# Patient Record
Sex: Male | Born: 1965 | Race: Black or African American | Hispanic: No | Marital: Single | Smoking: Former smoker
Health system: Southern US, Community
[De-identification: ages and names within clinical notes are randomized; demographics above are authoritative.]

## PROBLEM LIST (undated history)

## (undated) DIAGNOSIS — I10 Essential (primary) hypertension: Secondary | ICD-10-CM

## (undated) DIAGNOSIS — J45909 Unspecified asthma, uncomplicated: Secondary | ICD-10-CM

## (undated) DIAGNOSIS — J40 Bronchitis, not specified as acute or chronic: Secondary | ICD-10-CM

## (undated) DIAGNOSIS — F419 Anxiety disorder, unspecified: Secondary | ICD-10-CM

## (undated) DIAGNOSIS — F319 Bipolar disorder, unspecified: Secondary | ICD-10-CM

## (undated) DIAGNOSIS — R0602 Shortness of breath: Secondary | ICD-10-CM

## (undated) DIAGNOSIS — I219 Acute myocardial infarction, unspecified: Secondary | ICD-10-CM

## (undated) DIAGNOSIS — R51 Headache: Secondary | ICD-10-CM

## (undated) HISTORY — PX: CORONARY STENT PLACEMENT: SHX1402

---

## 2013-07-23 ENCOUNTER — Emergency Department (INDEPENDENT_AMBULATORY_CARE_PROVIDER_SITE_OTHER)
Admission: EM | Admit: 2013-07-23 | Discharge: 2013-07-23 | Disposition: A | Discharge status: Home / Self Care | Payer: Self-pay | Source: Home / Self Care | Attending: Family Medicine | Admitting: Family Medicine

## 2013-07-23 ENCOUNTER — Emergency Department (INDEPENDENT_AMBULATORY_CARE_PROVIDER_SITE_OTHER): Payer: Self-pay

## 2013-07-23 ENCOUNTER — Encounter (HOSPITAL_COMMUNITY): Payer: Self-pay | Admitting: Emergency Medicine

## 2013-07-23 DIAGNOSIS — J209 Acute bronchitis, unspecified: Secondary | ICD-10-CM

## 2013-07-23 HISTORY — DX: Acute myocardial infarction, unspecified: I21.9

## 2013-07-23 HISTORY — DX: Essential (primary) hypertension: I10

## 2013-07-23 MED ORDER — CEFDINIR 300 MG PO CAPS: 300.0000 mg | ORAL_CAPSULE | Freq: Two times a day (BID) | ORAL | Status: DC

## 2013-07-23 MED ORDER — DEXTROMETHORPHAN POLISTIREX 30 MG/5ML PO LQCR: 60.0000 mg | Freq: Two times a day (BID) | ORAL | Status: DC

## 2013-07-23 NOTE — ED Provider Notes (Signed)
CSN: [id number]     Arrival date & time 07/23/13  0865 History   First MD Initiated Contact with Patient 07/23/13 0940     Chief Complaint  Patient presents with  . Chest Pain   (Consider location/radiation/quality/duration/timing/severity/associated sxs/prior Treatment) Patient is a 47 y.o. male presenting with chest pain. The history is provided by the patient.  Chest Pain Pain location:  L lateral chest Pain quality: sharp   Pain radiates to:  Does not radiate Pain radiates to the back: no   Pain severity:  Mild Onset quality:  Sudden Duration:  12 hours Timing:  Constant Progression:  Unchanged Chronicity:  New Context comment:  Coughing last eve in spasm for uri over past 2-3 d.. Associated symptoms: cough   Associated symptoms: no abdominal pain, no fever, no palpitations and no shortness of breath   Associated symptoms comment:  Productive of yellow phlegm.   Past Medical History  Diagnosis Date  . Heart attack   . Hypertension    Past Surgical History  Procedure Laterality Date  . Coronary stent placement     History reviewed. No pertinent family history. History  Substance Use Topics  . Smoking status: Former Games developer  . Smokeless tobacco: Not on file  . Alcohol Use: No    Review of Systems  Constitutional: Negative.  Negative for fever.  HENT: Positive for congestion, postnasal drip and rhinorrhea.   Respiratory: Positive for cough. Negative for chest tightness and shortness of breath.   Cardiovascular: Positive for chest pain. Negative for palpitations and leg swelling.  Gastrointestinal: Negative.  Negative for abdominal pain.    Allergies  Review of patient's allergies indicates no known allergies.  Home Medications   Current Outpatient Rx  Name  Route  Sig  Dispense  Refill  . albuterol (PROVENTIL HFA;VENTOLIN HFA) 108 (90 BASE) MCG/ACT inhaler   Inhalation   Inhale into the lungs every 6 (six) hours as needed for wheezing or shortness of  breath.         Marland Kitchen aspirin 81 MG tablet   Oral   Take 81 mg by mouth daily.         . benazepril (LOTENSIN) 5 MG tablet   Oral   Take 5 mg by mouth daily.         . benztropine (COGENTIN) 0.5 MG tablet   Oral   Take 0.5 mg by mouth 2 (two) times daily.         . clonazePAM (KLONOPIN) 0.5 MG tablet   Oral   Take 0.5 mg by mouth 2 (two) times daily as needed for anxiety.         . divalproex (DEPAKOTE) 500 MG DR tablet   Oral   Take 500 mg by mouth 3 (three) times daily.         Marland Kitchen FLUoxetine (PROZAC) 10 MG capsule   Oral   Take 10 mg by mouth daily.         . haloperidol (HALDOL) 5 MG tablet   Oral   Take 5 mg by mouth 2 (two) times daily.         . nitroGLYCERIN (NITROSTAT) 0.4 MG SL tablet   Sublingual   Place 0.4 mg under the tongue every 5 (five) minutes as needed for chest pain.         . pravastatin (PRAVACHOL) 10 MG tablet   Oral   Take 10 mg by mouth daily.         . traZODone (  DESYREL) 100 MG tablet   Oral   Take 100 mg by mouth at bedtime.         . cefdinir (OMNICEF) 300 MG capsule   Oral   Take 1 capsule (300 mg total) by mouth 2 (two) times daily.   20 capsule   0   . dextromethorphan (DELSYM) 30 MG/5ML liquid   Oral   Take 10 mLs (60 mg total) by mouth 2 (two) times daily. For cough   89 mL   1    BP 145/87  Pulse 76  Temp(Src) 97.6 F (36.4 C) (Oral)  Resp 16  SpO2 100% Physical Exam  Nursing note and vitals reviewed. Constitutional: He is oriented to person, place, and time. He appears well-developed and well-nourished.  HENT:  Head: Normocephalic.  Right Ear: External ear normal.  Left Ear: External ear normal.  Mouth/Throat: Oropharynx is clear and moist.  Eyes: Conjunctivae are normal. Pupils are equal, round, and reactive to light.  Neck: Normal range of motion. Neck supple.  Cardiovascular: Normal rate, normal heart sounds and intact distal pulses.   Pulmonary/Chest: No respiratory distress. He has no  decreased breath sounds. He has no wheezes. He has rhonchi. He has no rales. He exhibits tenderness.  Lymphadenopathy:    He has no cervical adenopathy.  Neurological: He is alert and oriented to person, place, and time.  Skin: Skin is warm and dry.    ED Course  Procedures (including critical care time) Labs Review Labs Reviewed - No data to display Imaging Review Dg Chest 2 View  07/23/2013   CLINICAL DATA:  Chest pain for 2 days  EXAM: CHEST  2 VIEW  COMPARISON:  None.  FINDINGS: The heart size and mediastinal contours are within normal limits. Both lungs are clear. The visualized skeletal structures are unremarkable.  IMPRESSION: No active cardiopulmonary disease.   Electronically Signed   By: Elige Ko   On: 07/23/2013 10:29    EKG Interpretation     Ventricular Rate:    PR Interval:    QRS Duration:   QT Interval:    QTC Calculation:   R Axis:     Text Interpretation:              MDM  X-rays reviewed and report per radiologist.     Linna Hoff, MD 07/23/13 1104

## 2013-07-23 NOTE — ED Notes (Signed)
States he was up all night coughing and his chest is now sore

## 2013-08-29 ENCOUNTER — Other Ambulatory Visit: Payer: Self-pay

## 2013-08-29 ENCOUNTER — Emergency Department (HOSPITAL_COMMUNITY): Payer: Medicaid Other

## 2013-08-29 ENCOUNTER — Emergency Department (HOSPITAL_COMMUNITY)
Admission: EM | Admit: 2013-08-29 | Discharge: 2013-08-29 | Disposition: A | Discharge status: Home / Self Care | Payer: Medicaid Other | Attending: Emergency Medicine | Admitting: Emergency Medicine

## 2013-08-29 ENCOUNTER — Encounter (HOSPITAL_COMMUNITY): Payer: Self-pay | Admitting: Emergency Medicine

## 2013-08-29 DIAGNOSIS — Z79899 Other long term (current) drug therapy: Secondary | ICD-10-CM | POA: Insufficient documentation

## 2013-08-29 DIAGNOSIS — Z5900 Homelessness unspecified: Secondary | ICD-10-CM | POA: Insufficient documentation

## 2013-08-29 DIAGNOSIS — R05 Cough: Secondary | ICD-10-CM | POA: Insufficient documentation

## 2013-08-29 DIAGNOSIS — R071 Chest pain on breathing: Secondary | ICD-10-CM | POA: Insufficient documentation

## 2013-08-29 DIAGNOSIS — Z87891 Personal history of nicotine dependence: Secondary | ICD-10-CM | POA: Insufficient documentation

## 2013-08-29 DIAGNOSIS — I2510 Atherosclerotic heart disease of native coronary artery without angina pectoris: Secondary | ICD-10-CM | POA: Insufficient documentation

## 2013-08-29 DIAGNOSIS — F319 Bipolar disorder, unspecified: Secondary | ICD-10-CM | POA: Insufficient documentation

## 2013-08-29 DIAGNOSIS — I252 Old myocardial infarction: Secondary | ICD-10-CM | POA: Insufficient documentation

## 2013-08-29 DIAGNOSIS — R059 Cough, unspecified: Secondary | ICD-10-CM | POA: Insufficient documentation

## 2013-08-29 DIAGNOSIS — Z888 Allergy status to other drugs, medicaments and biological substances status: Secondary | ICD-10-CM | POA: Insufficient documentation

## 2013-08-29 DIAGNOSIS — R0602 Shortness of breath: Secondary | ICD-10-CM | POA: Insufficient documentation

## 2013-08-29 DIAGNOSIS — I10 Essential (primary) hypertension: Secondary | ICD-10-CM | POA: Insufficient documentation

## 2013-08-29 DIAGNOSIS — R0789 Other chest pain: Secondary | ICD-10-CM

## 2013-08-29 HISTORY — DX: Bipolar disorder, unspecified: F31.9

## 2013-08-29 LAB — CBC
HCT: 38.8 % — ABNORMAL LOW (ref 39.0–52.0)
Hemoglobin: 13.2 g/dL (ref 13.0–17.0)
MCH: 32.4 pg (ref 26.0–34.0)
MCV: 95.1 fL (ref 78.0–100.0)
RBC: 4.08 MIL/uL — ABNORMAL LOW (ref 4.22–5.81)
RDW: 12.2 % (ref 11.5–15.5)

## 2013-08-29 LAB — POCT I-STAT TROPONIN I: Troponin i, poc: 0.01 ng/mL (ref 0.00–0.08)

## 2013-08-29 LAB — BASIC METABOLIC PANEL
CO2: 24 mEq/L (ref 19–32)
Calcium: 8.8 mg/dL (ref 8.4–10.5)
GFR calc Af Amer: >90 mL/min (ref >90)
GFR calc non Af Amer: >90 mL/min (ref >90)
Glucose, Bld: 99 mg/dL (ref 70–99)
Sodium: 139 mEq/L (ref 135–145)

## 2013-08-29 MED ORDER — METHOCARBAMOL 500 MG PO TABS: 500.0000 mg | ORAL_TABLET | Freq: Two times a day (BID) | ORAL | Status: DC

## 2013-08-29 MED ORDER — ASPIRIN 81 MG PO CHEW
324.0000 mg | CHEWABLE_TABLET | Freq: Once | ORAL | Status: AC
  Administered 2013-08-29: 324 mg via ORAL
  Filled 2013-08-29: qty 4

## 2013-08-29 MED ORDER — NITROGLYCERIN 2 % TD OINT
1.0000 [in_us] | TOPICAL_OINTMENT | Freq: Once | TRANSDERMAL | Status: AC
  Administered 2013-08-29: 1 [in_us] via TOPICAL
  Filled 2013-08-29: qty 1

## 2013-08-29 NOTE — ED Notes (Signed)
Dr. Ranae Palms aware of pain.  ? Chest wall pain.  Waiting for 2nd. Troponin. Pt. Is sleeping.

## 2013-08-29 NOTE — ED Notes (Signed)
Pt. reports mid chest pain for 3 days with SOB and nausea .

## 2013-08-29 NOTE — ED Provider Notes (Signed)
CSN: [id number]     Arrival date & time 08/29/13  0306 History   First MD Initiated Contact with Patient 08/29/13 [phone]     Chief Complaint  Patient presents with  . Chest Pain   (Consider location/radiation/quality/duration/timing/severity/associated sxs/prior Treatment) HPI Patient is homeless and states he's had 2-3 days of constant central chest pain. The pain does not radiate. It is worse with deep inspiration. The pain is also worse with movement of the arms and palpation of the chest. Patient admits to a nonproductive cough. He has had no fevers or chills. Patient does have a history of coronary artery disease and states he had a stent placed back in 2009. This was done in [place]. He has not followed by cardiologist. He denies any lower extremity swelling or pain. Past Medical History  Diagnosis Date  . Heart attack   . Hypertension   . Bipolar 1 disorder    Past Surgical History  Procedure Laterality Date  . Coronary stent placement     No family history on file. History  Substance Use Topics  . Smoking status: Former Games developer  . Smokeless tobacco: Not on file  . Alcohol Use: No    Review of Systems  Constitutional: Negative for fever and chills.  Respiratory: Positive for cough and shortness of breath.   Cardiovascular: Positive for chest pain. Negative for palpitations and leg swelling.  Gastrointestinal: Negative for nausea, vomiting, abdominal pain and diarrhea.  Genitourinary: Negative for dysuria, frequency and hematuria.  Musculoskeletal: Negative for back pain, neck pain and neck stiffness.  Skin: Negative for rash and wound.  Neurological: Negative for dizziness, syncope, weakness, light-headedness, numbness and headaches.  All other systems reviewed and are negative.    Allergies  Ibuprofen and Tylenol  Home Medications   Current Outpatient Rx  Name  Route  Sig  Dispense  Refill  . albuterol (PROVENTIL HFA;VENTOLIN HFA) 108 (90 BASE) MCG/ACT  inhaler   Inhalation   Inhale into the lungs every 6 (six) hours as needed for wheezing or shortness of breath.         . divalproex (DEPAKOTE) 500 MG DR tablet   Oral   Take 500 mg by mouth 3 (three) times daily.         Marland Kitchen FLUoxetine (PROZAC) 10 MG capsule   Oral   Take 10 mg by mouth daily.         . haloperidol (HALDOL) 5 MG tablet   Oral   Take 5 mg by mouth 2 (two) times daily.         . traZODone (DESYREL) 100 MG tablet   Oral   Take 100 mg by mouth at bedtime.         . benazepril (LOTENSIN) 5 MG tablet   Oral   Take 5 mg by mouth daily.         . benztropine (COGENTIN) 0.5 MG tablet   Oral   Take 0.5 mg by mouth 2 (two) times daily.         . clonazePAM (KLONOPIN) 0.5 MG tablet   Oral   Take 0.5 mg by mouth 2 (two) times daily as needed for anxiety.         . nitroGLYCERIN (NITROSTAT) 0.4 MG SL tablet   Sublingual   Place 0.4 mg under the tongue every 5 (five) minutes as needed for chest pain.         . pravastatin (PRAVACHOL) 10 MG tablet   Oral  Take 10 mg by mouth daily.          BP 130/74  Pulse 66  Temp(Src) 98.6 F (37 C) (Oral)  Resp 14  Ht 5\' 4"  (1.626 m)  Wt 160 lb (72.576 kg)  BMI 27.45 kg/m2  SpO2 100% Physical Exam  Nursing note and vitals reviewed. Constitutional: He is oriented to person, place, and time. He appears well-developed and well-nourished. No distress.  Resting comfortably on the bed.  HENT:  Head: Normocephalic and atraumatic.  Mouth/Throat: Oropharynx is clear and moist.  Eyes: EOM are normal. Pupils are equal, round, and reactive to light.  Neck: Normal range of motion. Neck supple.  Cardiovascular: Normal rate and regular rhythm.   Pulmonary/Chest: Effort normal and breath sounds normal. No respiratory distress. He has no wheezes. He has no rales. He exhibits tenderness (tenderness to palpation over the sternum, left sternal border, right sternal border. No crepitance or deformity.).  Abdominal:  Soft. Bowel sounds are normal. He exhibits no distension. There is no tenderness. There is no rebound and no guarding.  Musculoskeletal: Normal range of motion. He exhibits no edema and no tenderness.  No calf swelling or tenderness.  Neurological: He is alert and oriented to person, place, and time.  Patient is alert and oriented x3 with clear, goal oriented speech. Patient has 5/5 motor in all extremities. Sensation is intact to light touch.    Skin: Skin is warm and dry. No rash noted. No erythema.  Psychiatric: He has a normal mood and affect. His behavior is normal.    ED Course  Procedures (including critical care time) Labs Review Labs Reviewed  CBC - Abnormal; Notable for the following:    RBC 4.08 (*)    HCT 38.8 (*)    All other components within normal limits  BASIC METABOLIC PANEL  PRO B NATRIURETIC PEPTIDE  POCT I-STAT TROPONIN I   Imaging Review Dg Chest 2 View  08/29/2013   CLINICAL DATA:  Chest pain  EXAM: CHEST  2 VIEW  COMPARISON:  07/23/2013  FINDINGS: The heart size and mediastinal contours are within normal limits. Both lungs are clear. The visualized skeletal structures are unremarkable.  IMPRESSION: No active cardiopulmonary disease.   Electronically Signed   By: Tiburcio Pea M.D.   On: 08/29/2013 04:08    EKG Interpretation   None       MDM  Likely the cause of patient's chest pain is musculoskeletal. His initial EKG and troponin are normal.  Patient is sleeping in his bed in no apparent discomfort.  Troponin x2 was negative. Patient's chest pain can be reproduced with palpation is left sternal border. He is resting comfortably. His vital signs remained stable. In fact he does have risk factors for coronary artery disease I recommend he followup with cardiology as an outpatient. Return precautions given.    Loren Racer, MD 08/31/13 [phone]

## 2013-09-23 ENCOUNTER — Emergency Department (HOSPITAL_COMMUNITY)
Admission: EM | Admit: 2013-09-23 | Discharge: 2013-09-23 | Disposition: A | Discharge status: Home / Self Care | Payer: Medicaid Other | Attending: Emergency Medicine | Admitting: Emergency Medicine

## 2013-09-23 ENCOUNTER — Emergency Department (HOSPITAL_COMMUNITY): Payer: Medicaid Other

## 2013-09-23 ENCOUNTER — Encounter (HOSPITAL_COMMUNITY): Payer: Self-pay | Admitting: Emergency Medicine

## 2013-09-23 DIAGNOSIS — R059 Cough, unspecified: Secondary | ICD-10-CM | POA: Insufficient documentation

## 2013-09-23 DIAGNOSIS — R509 Fever, unspecified: Secondary | ICD-10-CM | POA: Insufficient documentation

## 2013-09-23 DIAGNOSIS — J45909 Unspecified asthma, uncomplicated: Secondary | ICD-10-CM | POA: Insufficient documentation

## 2013-09-23 DIAGNOSIS — R079 Chest pain, unspecified: Secondary | ICD-10-CM

## 2013-09-23 DIAGNOSIS — I219 Acute myocardial infarction, unspecified: Secondary | ICD-10-CM | POA: Insufficient documentation

## 2013-09-23 DIAGNOSIS — Z79899 Other long term (current) drug therapy: Secondary | ICD-10-CM | POA: Insufficient documentation

## 2013-09-23 DIAGNOSIS — R05 Cough: Secondary | ICD-10-CM | POA: Insufficient documentation

## 2013-09-23 DIAGNOSIS — Z87891 Personal history of nicotine dependence: Secondary | ICD-10-CM | POA: Insufficient documentation

## 2013-09-23 DIAGNOSIS — I10 Essential (primary) hypertension: Secondary | ICD-10-CM | POA: Insufficient documentation

## 2013-09-23 DIAGNOSIS — J020 Streptococcal pharyngitis: Secondary | ICD-10-CM | POA: Insufficient documentation

## 2013-09-23 DIAGNOSIS — J3489 Other specified disorders of nose and nasal sinuses: Secondary | ICD-10-CM | POA: Insufficient documentation

## 2013-09-23 DIAGNOSIS — J40 Bronchitis, not specified as acute or chronic: Secondary | ICD-10-CM | POA: Insufficient documentation

## 2013-09-23 DIAGNOSIS — R197 Diarrhea, unspecified: Secondary | ICD-10-CM | POA: Insufficient documentation

## 2013-09-23 DIAGNOSIS — F319 Bipolar disorder, unspecified: Secondary | ICD-10-CM | POA: Insufficient documentation

## 2013-09-23 DIAGNOSIS — R112 Nausea with vomiting, unspecified: Secondary | ICD-10-CM | POA: Insufficient documentation

## 2013-09-23 HISTORY — DX: Bronchitis, not specified as acute or chronic: J40

## 2013-09-23 HISTORY — DX: Unspecified asthma, uncomplicated: J45.909

## 2013-09-23 LAB — BASIC METABOLIC PANEL
BUN: 19 mg/dL (ref 6–23)
CALCIUM: 9 mg/dL (ref 8.4–10.5)
CO2: 25 mEq/L (ref 19–32)
Chloride: 105 mEq/L (ref 96–112)
Creatinine, Ser: 0.81 mg/dL (ref 0.50–1.35)
GFR calc Af Amer: >90 mL/min (ref >90)
Glucose, Bld: 96 mg/dL (ref 70–99)
Potassium: 4.3 mEq/L (ref 3.7–5.3)
SODIUM: 143 meq/L (ref 137–147)

## 2013-09-23 LAB — CBC
HCT: 40.3 % (ref 39.0–52.0)
Hemoglobin: 13.6 g/dL (ref 13.0–17.0)
MCH: 32.3 pg (ref 26.0–34.0)
MCHC: 33.7 g/dL (ref 30.0–36.0)
MCV: 95.7 fL (ref 78.0–100.0)
PLATELETS: 195 10*3/uL (ref 150–400)
RBC: 4.21 MIL/uL — ABNORMAL LOW (ref 4.22–5.81)
RDW: 12.4 % (ref 11.5–15.5)
WBC: 3.7 10*3/uL — ABNORMAL LOW (ref 4.0–10.5)

## 2013-09-23 LAB — RAPID STREP SCREEN (MED CTR MEBANE ONLY): STREPTOCOCCUS, GROUP A SCREEN (DIRECT): POSITIVE — AB

## 2013-09-23 LAB — POCT I-STAT TROPONIN I: TROPONIN I, POC: 0 ng/mL (ref 0.00–0.08)

## 2013-09-23 MED ORDER — SODIUM CHLORIDE 0.9 % IV BOLUS (SEPSIS)
1000.0000 mL | Freq: Once | INTRAVENOUS | Status: AC
  Administered 2013-09-23: 1000 mL via INTRAVENOUS

## 2013-09-23 MED ORDER — PENICILLIN G BENZATHINE 1200000 UNIT/2ML IM SUSP
1.2000 10*6.[IU] | Freq: Once | INTRAMUSCULAR | Status: AC
Start: 2013-09-23 — End: 2013-09-23
  Administered 2013-09-23: 1.2 10*6.[IU] via INTRAMUSCULAR
  Filled 2013-09-23: qty 2

## 2013-09-23 MED ORDER — IBUPROFEN 400 MG PO TABS
600.0000 mg | ORAL_TABLET | Freq: Once | ORAL | Status: AC
  Administered 2013-09-23: 14:00:00 600 mg via ORAL
  Filled 2013-09-23 (×2): qty 1

## 2013-09-23 NOTE — Discharge Planning (Signed)
[facility], [employer]  Primary care being established. Patient has an appointment with the [facility] 09/28/13 at 4:00. Patient will also be obtaining the orange card on 10/12/13 at 4:30 with this practice. My contact information was given for any questions or concerns. Patient is aware of both appointments.

## 2013-09-23 NOTE — ED Notes (Signed)
PA at bedside.

## 2013-09-23 NOTE — ED Notes (Signed)
No reaction noted to antibiotic. Pt ambulates without distress. Resp easy non labored

## 2013-09-23 NOTE — ED Provider Notes (Signed)
CSN: [id number]     Arrival date & time 09/23/13  1018 History   None    Chief Complaint  Patient presents with  . URI    HPI  Jesse Watkins is a 48 y.o. male with a PMH of MI, CAD s/p stent, HTN, bipolar disorder, asthma, and bronchitis who presents to the ED for evaluation of URI.  History was provided by the patient.  Patient states that he has not been feeling well for the past 3 or 4 days. He states that he has had a productive cough with yellow sputum and intermittent blood tinged sputum. He denies gross hemoptysis. He states that he intermittently has had shortness of breath which is then relieved after using his albuterol inhaler. He denies any shortness of breath currently. He states that he has had constant chest pain with a past 3 days. His chest pain is described as a sharp sensation which is worse with coughing. His chest pain is located in the midsternal and left-sided his chest.  His chest pain today is not similar to the chest pain he had when he had an MI. Patient has also had decreased appetite, subjective fever, chills, nausea, fatigue, myalgias, intermittent lightheadedness, rhinorrhea, sore throat, nasal congestion, generalized headache, and generalized weakness. He states that last night he had 2 episodes of emesis as well as watery diarrhea. He denies any vomiting or diarrhea today. He denies any abdominal pain, hematochezia, or black or tarry stools. [facility] and [facility]". He has been trying DayQuil and NyQuil to treat his symptoms however this has not been working. Patient did not receive the flu shot this year. He did not have a cardiologist currently. Patient takes a daily aspirin. He is a previous smoker. No recent travel.   Past Medical History  Diagnosis Date  . Heart attack   . Hypertension   . Bipolar 1 disorder   . Asthma   . Bronchitis    Past Surgical History  Procedure Laterality Date  . Coronary stent placement      History reviewed. No pertinent family history. History  Substance Use Topics  . Smoking status: Former Games developer  . Smokeless tobacco: Not on file  . Alcohol Use: No    Review of Systems  Constitutional: Positive for fever (subjective), chills, activity change, appetite change and fatigue. Negative for diaphoresis.  HENT: Positive for congestion, rhinorrhea and sore throat. Negative for ear pain, sinus pressure and sneezing.   Respiratory: Positive for cough and shortness of breath (intermittent).   Cardiovascular: Positive for chest pain. Negative for leg swelling.  Gastrointestinal: Positive for nausea, vomiting and diarrhea. Negative for abdominal pain, constipation and blood in stool.  Genitourinary: Negative for dysuria, decreased urine volume and difficulty urinating.  Musculoskeletal: Positive for myalgias. Negative for arthralgias, back pain and neck pain.  Skin: Negative for color change, rash and wound.  Neurological: Positive for weakness (generalized), light-headedness (intermittent) and headaches. Negative for dizziness and syncope.  Psychiatric/Behavioral: Negative for confusion.    Allergies  Ibuprofen and Tylenol  Home Medications   Current Outpatient Rx  Name  Route  Sig  Dispense  Refill  . albuterol (PROVENTIL HFA;VENTOLIN HFA) 108 (90 BASE) MCG/ACT inhaler   Inhalation   Inhale into the lungs every 6 (six) hours as needed for wheezing or shortness of breath.         . benazepril (LOTENSIN) 5 MG tablet   Oral   Take 5 mg  by mouth daily.         . benztropine (COGENTIN) 0.5 MG tablet   Oral   Take 0.5 mg by mouth 2 (two) times daily.         . clonazePAM (KLONOPIN) 0.5 MG tablet   Oral   Take 0.5 mg by mouth 2 (two) times daily as needed for anxiety.         . divalproex (DEPAKOTE) 500 MG DR tablet   Oral   Take 500 mg by mouth 3 (three) times daily.         Marland Kitchen FLUoxetine (PROZAC) 10 MG capsule   Oral   Take 10 mg by mouth daily.          . haloperidol (HALDOL) 5 MG tablet   Oral   Take 5 mg by mouth 2 (two) times daily.         . methocarbamol (ROBAXIN) 500 MG tablet   Oral   Take 1 tablet (500 mg total) by mouth 2 (two) times daily.   20 tablet   0   . nitroGLYCERIN (NITROSTAT) 0.4 MG SL tablet   Sublingual   Place 0.4 mg under the tongue every 5 (five) minutes as needed for chest pain.         . pravastatin (PRAVACHOL) 10 MG tablet   Oral   Take 10 mg by mouth daily.         . traZODone (DESYREL) 100 MG tablet   Oral   Take 100 mg by mouth at bedtime.          BP 128/94  Pulse 74  Temp(Src) 97.9 F (36.6 C) (Oral)  Resp 15  Ht 5\' 4"  (1.626 m)  Wt 160 lb (72.576 kg)  BMI 27.45 kg/m2  SpO2 98%  Filed Vitals:   09/23/13 1300 09/23/13 1330 09/23/13 1400 09/23/13 1411  BP: 148/89 138/87 145/97 152/95  Pulse: 53 55 56 57  Temp:      TempSrc:      Resp: 15 15 19    Height:      Weight:      SpO2: 100% 98% 97% 99%    Physical Exam  Nursing note and vitals reviewed. Constitutional: He is oriented to person, place, and time. He appears well-developed and well-nourished. No distress.  HENT:  Head: Normocephalic and atraumatic.  Right Ear: External ear normal.  Left Ear: External ear normal.  Nose: Nose normal.  Mouth/Throat: Oropharynx is clear and moist. No oropharyngeal exudate.  Tympanic membranes gray and translucent bilaterally with no erythema, edema, or hemotympanum.  Mild erythema to the posterior pharynx.  Uvula midline.  No trismus.  Nasal congestion.    Eyes: Conjunctivae and EOM are normal. Pupils are equal, round, and reactive to light. Right eye exhibits no discharge. Left eye exhibits no discharge.  Neck: Normal range of motion. Neck supple.  No cervical spinal or paraspinal tenderness to palpation throughout.  No limitations with neck ROM.  No LAD  Cardiovascular: Normal rate, regular rhythm and normal heart sounds.  Exam reveals no gallop and no friction rub.   No murmur  heard. Dorsalis pedis pulses present and equal bilaterally  Pulmonary/Chest: Effort normal and breath sounds normal. No respiratory distress. He has no wheezes. He has no rales. He exhibits tenderness.  Mid-sternal tenderness to palpation  Abdominal: Soft. He exhibits no distension and no mass. There is no tenderness. There is no rebound and no guarding.  Musculoskeletal: Normal range of motion. He exhibits no  edema and no tenderness.  No pedal edema or calf tenderness bilaterally.    Neurological: He is alert and oriented to person, place, and time.  GCS 15.  No focal neurological deficits.  CN 2-12 intact.  No pronator drift.    Skin: Skin is warm and dry. He is not diaphoretic.    ED Course  Procedures (including critical care time) Labs Review Labs Reviewed  CBC  BASIC METABOLIC PANEL  PRO B NATRIURETIC PEPTIDE   Imaging Review Dg Chest 2 View  09/23/2013   CLINICAL DATA:  Upper respiratory tract infections; fever  EXAM: CHEST  2 VIEW  COMPARISON:  August 29, 2013  FINDINGS: Lungs are clear. Heart size and pulmonary vascularity are normal. No adenopathy. No bone lesions.  IMPRESSION: No abnormality noted.   Electronically Signed   By: Bretta Bang M.D.   On: 09/23/2013 11:03    EKG Interpretation   None       Date: 09/24/2013  Rate: 68  Rhythm: normal sinus rhythm  QRS Axis: normal  Intervals: normal  ST/T Wave abnormalities: normal  Conduction Disutrbances:none  Narrative Interpretation:   Old EKG Reviewed: unchanged   Results for orders placed during the hospital encounter of 09/23/13  RAPID STREP SCREEN      Result Value Range   Streptococcus, Group A Screen (Direct) POSITIVE (*) NEGATIVE  CBC      Result Value Range   WBC 3.7 (*) 4.0 - 10.5 K/uL   RBC 4.21 (*) 4.22 - 5.81 MIL/uL   Hemoglobin 13.6  13.0 - 17.0 g/dL   HCT 40.9  81.1 - 91.4 %   MCV 95.7  78.0 - 100.0 fL   MCH 32.3  26.0 - 34.0 pg   MCHC 33.7  30.0 - 36.0 g/dL   RDW 78.2  95.6 - 21.3 %    Platelets 195  150 - 400 K/uL  BASIC METABOLIC PANEL      Result Value Range   Sodium 143  137 - 147 mEq/L   Potassium 4.3  3.7 - 5.3 mEq/L   Chloride 105  96 - 112 mEq/L   CO2 25  19 - 32 mEq/L   Glucose, Bld 96  70 - 99 mg/dL   BUN 19  6 - 23 mg/dL   Creatinine, Ser 0.86  0.50 - 1.35 mg/dL   Calcium 9.0  8.4 - 57.8 mg/dL   GFR calc non Af Amer >90  >90 mL/min   GFR calc Af Amer >90  >90 mL/min  POCT I-STAT TROPONIN I      Result Value Range   Troponin i, poc 0.00  0.00 - 0.08 ng/mL   Comment 3            DG Chest 2 View (Final result)  Result time: 09/23/13 11:03:12    Final result by Rad Results In Interface (09/23/13 11:03:12)    Narrative:   CLINICAL DATA: Upper respiratory tract infections; fever  EXAM: CHEST 2 VIEW  COMPARISON: August 29, 2013  FINDINGS: Lungs are clear. Heart size and pulmonary vascularity are normal. No adenopathy. No bone lesions.  IMPRESSION: No abnormality noted.   Electronically Signed By: Bretta Bang M.D. On: 09/23/2013 11:03         MDM   Jesse Watkins is a 48 y.o. male with a PMH of MI, CAD s/p stent, HTN, bipolar disorder, asthma, and bronchitis who presents to the ED for evaluation of URI and multiple complaints.    Rechecks  2:00  PM = patient asking for ibuprofen. Patient states that he's been taking the last few days with no adverse reactions. Patient's allergies is a elevated heart rate. Denies any anaphylaxis.   Patient found to have strep pharyngitis and was treated with IM penicillin.  Patient afebrile and non-toxic in appearance.  Labs unremarkable.  Patient also complained of a cough. Chest x-ray negative for an acute cardiopulmonary process.  He also has had chest pain x 3 days which is worse with coughing.  Pain likely musculoskeletal in nature.  Pain reproducible. EKG negative for any acute ischemic changes.  Troponin negative.  Patient instructed to establish a cardiologist and was given resources.   Patient also instructed to follow-up with a PCP. Return precautions, discharge instructions, and follow-up was discussed with the patient before discharge.     Discharge Medication List as of 09/23/2013  1:58 PM     Final impressions: 1. Strep pharyngitis   2. Cough   3. Chest pain      Luiz IronJessica Katlin Brunette Lavalle PA-C   This patient was discussed with Dr. Skipper ClicheZackowski        Sair Faulcon K Gaynel Schaafsma, PA-C 09/24/13 [phone]

## 2013-09-23 NOTE — ED Notes (Signed)
Pt reports productive, n/v, chills, sweats, for past 3 days. States his chest hurts when he coughs and he has not had an appetite

## 2013-09-23 NOTE — Discharge Instructions (Signed)
Drink plenty of fluids and rest Return to the emergency department if you develop any changing/worsening condition, difficulty swallowing or breathing, stiff neck, rash, change or worsening chest pain, coughing up blood, fever, or any other concerns (please read additional information regarding your condition below)    Strep Throat Strep throat is an infection of the throat caused by a bacteria named Streptococcus pyogenes. Your caregiver may call the infection streptococcal "tonsillitis" or "pharyngitis" depending on whether there are signs of inflammation in the tonsils or back of the throat. Strep throat is most common in children aged 5 15 years during the cold months of the year, but it can occur in people of any age during any season. This infection is spread from person to person (contagious) through coughing, sneezing, or other close contact. SYMPTOMS   Fever or chills.  Painful, swollen, red tonsils or throat.  Pain or difficulty when swallowing.  White or yellow spots on the tonsils or throat.  Swollen, tender lymph nodes or "glands" of the neck or under the jaw.  Red rash all over the body (rare). DIAGNOSIS  Many different infections can cause the same symptoms. A test must be done to confirm the diagnosis so the right treatment can be given. A "rapid strep test" can help your caregiver make the diagnosis in a few minutes. If this test is not available, a light swab of the infected area can be used for a throat culture test. If a throat culture test is done, results are usually available in a day or two. TREATMENT  Strep throat is treated with antibiotic medicine. HOME CARE INSTRUCTIONS   Gargle with 1 tsp of salt in 1 cup of warm water, 3 4 times per day or as needed for comfort.  Family members who also have a sore throat or fever should be tested for strep throat and treated with antibiotics if they have the strep infection.  Make sure everyone in your household washes  their hands well.  Do not share food, drinking cups, or personal items that could cause the infection to spread to others.  You may need to eat a soft food diet until your sore throat gets better.  Drink enough water and fluids to keep your urine clear or pale yellow. This will help prevent dehydration.  Get plenty of rest.  Stay home from school, daycare, or work until you have been on antibiotics for 24 hours.  Only take over-the-counter or prescription medicines for pain, discomfort, or fever as directed by your caregiver.  If antibiotics are prescribed, take them as directed. Finish them even if you start to feel better. SEEK MEDICAL CARE IF:   The glands in your neck continue to enlarge.  You develop a rash, cough, or earache.  You cough up green, yellow-brown, or bloody sputum.  You have pain or discomfort not controlled by medicines.  Your problems seem to be getting worse rather than better. SEEK IMMEDIATE MEDICAL CARE IF:   You develop any new symptoms such as vomiting, severe headache, stiff or painful neck, chest pain, shortness of breath, or trouble swallowing.  You develop severe throat pain, drooling, or changes in your voice.  You develop swelling of the neck, or the skin on the neck becomes red and tender.  You have a fever.  You develop signs of dehydration, such as fatigue, dry mouth, and decreased urination.  You become increasingly sleepy, or you cannot wake up completely. Document Released: 08/23/2000 Document Revised: 08/12/2012 Document Reviewed:  10/25/2010 ExitCare Patient Information 2014 [place], [place].   Chest Pain (Nonspecific) It is often hard to give a specific diagnosis for the cause of chest pain. There is always a chance that your pain could be related to something serious, such as a heart attack or a blood clot in the lungs. You need to follow up with your caregiver for further evaluation. CAUSES   Heartburn.  Pneumonia or  bronchitis.  Anxiety or stress.  Inflammation around your heart (pericarditis) or lung (pleuritis or pleurisy).  A blood clot in the lung.  A collapsed lung (pneumothorax). It can develop suddenly on its own (spontaneous pneumothorax) or from injury (trauma) to the chest.  Shingles infection (herpes zoster virus). The chest wall is composed of bones, muscles, and cartilage. Any of these can be the source of the pain.  The bones can be bruised by injury.  The muscles or cartilage can be strained by coughing or overwork.  The cartilage can be affected by inflammation and become sore (costochondritis). DIAGNOSIS  Lab tests or other studies, such as X-rays, electrocardiography, stress testing, or cardiac imaging, may be needed to find the cause of your pain.  TREATMENT   Treatment depends on what may be causing your chest pain. Treatment may include:  Acid blockers for heartburn.  Anti-inflammatory medicine.  Pain medicine for inflammatory conditions.  Antibiotics if an infection is present.  You may be advised to change lifestyle habits. This includes stopping smoking and avoiding alcohol, caffeine, and chocolate.  You may be advised to keep your head raised (elevated) when sleeping. This reduces the chance of acid going backward from your stomach into your esophagus.  Most of the time, nonspecific chest pain will improve within 2 to 3 days with rest and mild pain medicine. HOME CARE INSTRUCTIONS   If antibiotics were prescribed, take your antibiotics as directed. Finish them even if you start to feel better.  For the next few days, avoid physical activities that bring on chest pain. Continue physical activities as directed.  Do not smoke.  Avoid drinking alcohol.  Only take over-the-counter or prescription medicine for pain, discomfort, or fever as directed by your caregiver.  Follow your caregiver's suggestions for further testing if your chest pain does not go  away.  Keep any follow-up appointments you made. If you do not go to an appointment, you could develop lasting (chronic) problems with pain. If there is any problem keeping an appointment, you must call to reschedule. SEEK MEDICAL CARE IF:   You think you are having problems from the medicine you are taking. Read your medicine instructions carefully.  Your chest pain does not go away, even after treatment.  You develop a rash with blisters on your chest. SEEK IMMEDIATE MEDICAL CARE IF:   You have increased chest pain or pain that spreads to your arm, neck, jaw, back, or abdomen.  You develop shortness of breath, an increasing cough, or you are coughing up blood.  You have severe back or abdominal pain, feel nauseous, or vomit.  You develop severe weakness, fainting, or chills.  You have a fever. THIS IS AN EMERGENCY. Do not wait to see if the pain will go away. Get medical help at once. Call your local emergency services (911 in U.S.). Do not drive yourself to the hospital. MAKE SURE YOU:   Understand these instructions.  Will watch your condition.  Will get help right away if you are not doing well or get worse. Document Released: 06/05/2005 Document  Revised: 11/18/2011 Document Reviewed: 03/31/2008 [facility] Patient Information 2014 [place], [place].    Cough, Adult  A cough is a reflex that helps clear your throat and airways. It can help heal the body or may be a reaction to an irritated airway. A cough may only last 2 or 3 weeks (acute) or may last more than 8 weeks (chronic).  CAUSES Acute cough:  Viral or bacterial infections. Chronic cough:  Infections.  Allergies.  Asthma.  Post-nasal drip.  Smoking.  Heartburn or acid reflux.  Some medicines.  Chronic lung problems (COPD).  Cancer. SYMPTOMS   Cough.  Fever.  Chest pain.  Increased breathing rate.  High-pitched whistling sound when breathing (wheezing).  Colored mucus that you cough up  (sputum). TREATMENT   A bacterial cough may be treated with antibiotic medicine.  A viral cough must run its course and will not respond to antibiotics.  Your caregiver may recommend other treatments if you have a chronic cough. HOME CARE INSTRUCTIONS   Only take over-the-counter or prescription medicines for pain, discomfort, or fever as directed by your caregiver. Use cough suppressants only as directed by your caregiver.  Use a cold steam vaporizer or humidifier in your bedroom or home to help loosen secretions.  Sleep in a semi-upright position if your cough is worse at night.  Rest as needed.  Stop smoking if you smoke. SEEK IMMEDIATE MEDICAL CARE IF:   You have pus in your sputum.  Your cough starts to worsen.  You cannot control your cough with suppressants and are losing sleep.  You begin coughing up blood.  You have difficulty breathing.  You develop pain which is getting worse or is uncontrolled with medicine.  You have a fever. MAKE SURE YOU:   Understand these instructions.  Will watch your condition.  Will get help right away if you are not doing well or get worse. Document Released: 02/22/2011 Document Revised: 11/18/2011 Document Reviewed: 02/22/2011 [facility] Patient Information 2014 [place], [place].   Emergency Department Resource Guide 1) Find a Doctor and Pay Out of Pocket Although you won't have to find out who is covered by your insurance plan, it is a good idea to ask around and get recommendations. You will then need to call the office and see if the doctor you have chosen will accept you as a new patient and what types of options they offer for patients who are self-pay. Some doctors offer discounts or will set up payment plans for their patients who do not have insurance, but you will need to ask so you aren't surprised when you get to your appointment.  2) Contact Your Local Health Department Not all health departments have doctors that can  see patients for sick visits, but many do, so it is worth a call to see if yours does. If you don't know where your local health department is, you can check in your phone book. The CDC also has a tool to help you locate your state's health department, and many state websites also have listings of all of their local health departments.  3) Find a Walk-in Clinic If your illness is not likely to be very severe or complicated, you may want to try a walk in clinic. These are popping up all over the country in pharmacies, drugstores, and shopping centers. They're usually staffed by nurse practitioners or physician assistants that have been trained to treat common illnesses and complaints. They're usually fairly quick and inexpensive. However, if you have serious  medical issues or chronic medical problems, these are probably not your best option.  No Primary Care Doctor: - Call Health Connect at  [phone] - they can help you locate a primary care doctor that  accepts your insurance, provides certain services, etc. - Physician Referral Service- [phone]  Chronic Pain Problems: Organization         Address  Phone   Notes  [facility]  [phone] Patients need to be referred by their primary care doctor.   Medication Assistance: Organization         Address  Phone   Notes  [facility] Medication [facility] [address] [address] [place], [place] [address] [phone] --Must be a resident of [facility] -- Must have NO insurance coverage whatsoever (no Medicaid/ Medicare, etc.) -- The pt. MUST have a primary care doctor that directs their care regularly and follows them in the community   MedAssist  [phone]   [employer]  [phone]    Agencies that provide inexpensive medical care: Organization         Address  Phone   Notes  [facility]  [phone]   [facility]    [phone]   [facility] [address] [place], [place] [address] [phone]   [facility] [address] [place] [place] [phone]   Planned Parenthood    [phone]   [facility]    [phone]   [facility]  [address], [place] Phone:  [phone], Fax:  [phone] Hours of Operation:  9 am - 6 pm, M-F.  Also accepts Medicaid/Medicare and self-pay.  [facility]  [address], [place] Phone: [phone], Fax: [phone]. Hours of Operation:  8:30 am - 5:30 pm, M-F.  Also accepts Medicaid and self-pay.  [facility] [place] [address] [place] Phone: [phone]   [facility] [address] Natasha Bence [place], [place] [phone], Ext. [phone] Mondays & Thursdays: 7-9 AM.  First 15 patients are seen on a first come, first serve basis.    Medicaid-accepting [facility] Providers:  Organization         Address  Phone   Notes  [facility] [address],  [phone] Also accepts self-pay patients.  [facility] [address] Laurell Josephs [place], [place]  [phone]   [facility] [address], [place] [phone]   [facility] [address], [place] [phone]   Renaye Rakers [address], [place]   [phone] Only accepts [place] patients after they have their name applied to their card.   Self-Pay (no insurance) in [facility]:  Organization         Address  Phone   Notes  Sickle Cell Patients, [facility] [address] [place] [address] [place], [place] [phone]   [facility] [address] [place], [place] [phone]   [facility] [place]  [address], [place] [phone]   [facility]/Dr. Osei-Bonsu   [address] [place] or [address], [place] [phone] Phone number for both [place] and [place] locations is  the same.  [facility] [address] [place] [phone]   [facility] [address] [place] or [address] [phone]   [facility] [address], [place] [phone], phone; [phone], fax Sees patients 1st and 3rd Saturday of every month.  Must not qualify for public or private insurance (i.e. Medicaid, Medicare, [place] Health Choice, Veterans' Benefits)  Household income should be no more than 200% of the poverty level The clinic cannot treat you if you are pregnant or think you are pregnant  Sexually transmitted diseases are not treated at the clinic.    Dental Care: Organization         Address  Phone  Notes  [facility] Department of [facility] [address] [place], [place] [phone] Accepts children up to age 46 who are enrolled in [place] or [place] Health Choice; pregnant women with a Medicaid card; and children who have applied for Medicaid or [place] Health Choice, but were declined, whose parents can pay a reduced fee at time of service.  [facility] Department of [facility]  [address], [place] [phone] Accepts children up to age 44 who are enrolled in [place] or [place] Health Choice; pregnant women with a Medicaid card; and children who have applied for Medicaid or [place] Health Choice, but were declined, whose parents can pay a reduced fee at time of service.  [facility]  [address] [place], [place] [phone] Patients are seen by appointment only. Walk-ins are not accepted. [facility] will see patients 52 years of age and older. Monday - Tuesday (8am-5pm) Most Wednesdays (8:30-5pm) $30 per visit, cash only  [facility]  PROGRAM  [address], [facility] [phone] Patients are seen by appointment only. Walk-ins are not accepted. [facility] will see patients 49 years of age and older. One Wednesday Evening (Monthly: Volunteer Based).  $30 per visit, cash only  [employer]  [phone] for adults; Children under age 53, call Graduate Pediatric Dentistry at [phone]. Children aged 36-14, please call [phone] to request a pediatric application.  Dental services are provided in all areas of dental care including fillings, crowns and bridges, complete and partial dentures, implants, gum treatment, root canals, and extractions. Preventive care is also provided. Treatment is provided to both adults and children. Patients are selected via a lottery and there is often a waiting list.   [facility] [address], [place]  [phone] [email or web address]   [facility] [address] [place], [place] [phone], Ext. [phone] Second and Fourth Thursday of each month, opens at 6:30 AM; Clinic ends at 9 AM.  Patients are seen on a first-come first-served basis, and a limited number are seen during each clinic.   [facility]  [address] Ether Griffins [place], [place] [phone]   Eligibility Requirements You must have lived in [place], [place], or [place] counties for at least the last three months.   You cannot be eligible for state or federal sponsored [place], including [employer], [place], or [employer].   You generally cannot be eligible for healthcare insurance through your employer.    How to apply: Eligibility screenings are held every Tuesday and Wednesday afternoon from 1:00 pm until 4:00 pm. You do not need an appointment for  the interview!  [facility] [address], [place], [place] [phone]   [facility]  [phone]   [facility]   [phone]   [facility]  [phone]    Behavioral Health Resources in the Community: Intensive Outpatient Programs Organization         Address  Phone  Notes  [facility] [address], [place], [place] [phone]   [facility] [address], [place], [place] [phone]   [facility] [address], [place], [place]  [phone]   [facility] [address],  [place], [place] [phone] or [phone]   Substance Abuse Resources Organization         Address  Phone  Notes  [facility]  [phone]   [facility]  [phone]   [facility]  [phone]   [facility]  [phone]   Residential & Outpatient Substance Abuse Program  [phone]   Psychological Services Organization         Address  Phone  Notes  [facility]  [phone]   [facility]  [phone]   [facility] [address], [place] [phone] or [phone]    Mobile Crisis Teams Organization         Address  Phone  Notes  [facility], Mobile Crisis Care Unit  [phone]   [facility]  [address] [place], [place] [phone]   Doristine LocksSharon DeEsch [address] [place] [phone]    Self-Help/Support Groups Organization         Address  Phone             Notes  [facility] [place] - variety of support groups  336- [phone] Call for more information  Narcotics Anonymous (NA), [facility] [address], [place]  2 meetings at this location   Statisticianesidential Treatment Programs Organization         Address  Phone  Notes  [facility] [address],    [place]  [phone]   [facility]  [address], [place] [phone], [place], [place] [phone]   [facility] [address], [place] [phone] Admissions: 8am-3pm M-F  [facility] [address]    [place], [place] [phone]   [facility] [address], [place], [place] [phone]   [facility] [address]  [place], [place] [phone]   [facility] - Intensive Outpatient [address], [place], [place] [phone]   [facility] ([facility]iction [facility] [address]  [place], [place] [phone] or [phone]   [facility] ([facility]) [address] [place], [place] [phone] Accepts Medicaid  [facility] [address]  [place] [phone] Substance Abuse/Addiction Treatment   [facility] Resources Organization         Address  Phone  Notes  [facility]  [phone]   Angie FavaJulie Brannon, PhD [address] [place], [place]   [phone] or [phone]   [facility]   [address] [place], [place] [phone]   [facility] [address], [place], [place] [phone] Insurance/Medicaid/sponsorship through [facility]  [facility]ith and Families [address]  [place], [place] [phone] [place], [place] [phone]    Dr. Adele Schilder  [phone]   [facility] Dept. 1) [address] [place] 2) [place] 3)  [address], [place] [phone]  [phone]   [place] [phone] or [phone] (After Hours)

## 2013-09-26 NOTE — ED Provider Notes (Signed)
Medical screening examination/treatment/procedure(s) were performed by non-physician practitioner and as supervising physician I was immediately available for consultation/collaboration.  EKG Interpretation    Date/Time:  Thursday September 23 2013 10:22:30 EST Ventricular Rate:  68 PR Interval:  152 QRS Duration: 80 QT Interval:  394 QTC Calculation: 418 R Axis:   48 Text Interpretation:  Normal sinus rhythm Possible Left atrial enlargement Borderline ECG ED PHYSICIAN INTERPRETATION AVAILABLE IN [facility] Confirmed by TEST, RECORD ([id number]) on 09/25/2013 1:18:33 PM              Shelda Jakes, MD 09/26/13 1028

## 2013-09-28 ENCOUNTER — Ambulatory Visit: Payer: Medicaid Other | Attending: Internal Medicine

## 2013-10-12 ENCOUNTER — Ambulatory Visit: Payer: Medicaid Other | Attending: Internal Medicine | Admitting: Internal Medicine

## 2013-10-12 ENCOUNTER — Encounter: Payer: Self-pay | Admitting: Internal Medicine

## 2013-10-12 VITALS — BP 145/90 | HR 95 | Temp 98.9°F | Resp 18 | Ht 65.0 in | Wt 166.0 lb

## 2013-10-12 DIAGNOSIS — F3289 Other specified depressive episodes: Secondary | ICD-10-CM

## 2013-10-12 DIAGNOSIS — I2510 Atherosclerotic heart disease of native coronary artery without angina pectoris: Secondary | ICD-10-CM

## 2013-10-12 DIAGNOSIS — I10 Essential (primary) hypertension: Secondary | ICD-10-CM

## 2013-10-12 DIAGNOSIS — F319 Bipolar disorder, unspecified: Secondary | ICD-10-CM

## 2013-10-12 DIAGNOSIS — I252 Old myocardial infarction: Secondary | ICD-10-CM

## 2013-10-12 DIAGNOSIS — E785 Hyperlipidemia, unspecified: Secondary | ICD-10-CM

## 2013-10-12 DIAGNOSIS — F329 Major depressive disorder, single episode, unspecified: Secondary | ICD-10-CM | POA: Insufficient documentation

## 2013-10-12 DIAGNOSIS — J45909 Unspecified asthma, uncomplicated: Secondary | ICD-10-CM

## 2013-10-12 DIAGNOSIS — F32A Depression, unspecified: Secondary | ICD-10-CM

## 2013-10-12 MED ORDER — NITROGLYCERIN 0.4 MG SL SUBL: 0.4000 mg | SUBLINGUAL_TABLET | SUBLINGUAL | Status: DC | PRN

## 2013-10-12 MED ORDER — PRAVASTATIN SODIUM 10 MG PO TABS: 10.0000 mg | ORAL_TABLET | Freq: Every day | ORAL | Status: DC

## 2013-10-12 MED ORDER — BENAZEPRIL HCL 5 MG PO TABS: 5.0000 mg | ORAL_TABLET | Freq: Every day | ORAL | Status: DC

## 2013-10-12 NOTE — Progress Notes (Signed)
Pt here to establish HTN  Noncompliant with medications Ran out of meds x 3 weeks ago C/o headache 7/10 Pt has scripts from [place] for psych meds

## 2013-10-12 NOTE — Progress Notes (Signed)
Patient Demographics  Jesse Watkins, is a 48 y.o. male  XKG:[id number]  FWY:[id number]  DOB - 06-25-1966  CC:  Chief Complaint  Patient presents with  . Establish Care  . Hypertension  . Medication Refill       HPI: Jesse Watkins is a 48 y.o. male here today to establish medical care. He has history of hypertension, history of MI CAD status post PCI with stent as per patient he has been taking aspirin could not afford the Plavix in the past, has lost follow up with cardiology occasionally has chest pain and is requesting refill on nitroglycerin, also history of anxiety depression bipolar disorder following up with psychiatrist. Patient currently has No headache, No chest pain, No abdominal pain - No Nausea, No new weakness tingling or numbness, No Cough - SOB. Patient is requesting refill on his medication, has not taken his blood pressure medication for the last one month today's repeat blood pressure is 145/90. Allergies  Allergen Reactions  . Tylenol [Acetaminophen] Other (See Comments)    Increases heart rate    Past Medical History  Diagnosis Date  . Heart attack   . Hypertension   . Bipolar 1 disorder   . Asthma   . Bronchitis    Current Outpatient Prescriptions on File Prior to Visit  Medication Sig Dispense Refill  . albuterol (PROVENTIL HFA;VENTOLIN HFA) 108 (90 BASE) MCG/ACT inhaler Inhale into the lungs every 6 (six) hours as needed for wheezing or shortness of breath.      . benztropine (COGENTIN) 0.5 MG tablet Take 0.5 mg by mouth 2 (two) times daily.      . clonazePAM (KLONOPIN) 0.5 MG tablet Take 0.5 mg by mouth 2 (two) times daily as needed for anxiety.      . divalproex (DEPAKOTE) 500 MG DR tablet Take 500 mg by mouth 3 (three) times daily.      Marland Kitchen FLUoxetine (PROZAC) 10 MG capsule Take 10 mg by mouth daily.      . haloperidol (HALDOL) 5 MG tablet Take 5 mg by mouth 2 (two) times daily.      . methocarbamol (ROBAXIN) 500 MG tablet Take 1 tablet (500 mg total)  by mouth 2 (two) times daily.  20 tablet  0  . traZODone (DESYREL) 100 MG tablet Take 100 mg by mouth at bedtime.       No current facility-administered medications on file prior to visit.   Family History  Problem Relation Age of Onset  . Heart disease Mother   . Heart disease Father   . Stroke Brother   . Cancer Maternal Grandmother     bone cancer    History   Social History  . Marital Status: Single    Spouse Name: N/A    Number of Children: N/A  . Years of Education: N/A   Occupational History  . Not on file.   Social History Main Topics  . Smoking status: Former Games developer  . Smokeless tobacco: Not on file  . Alcohol Use: No  . Drug Use: No  . Sexual Activity: Not on file   Other Topics Concern  . Not on file   Social History Narrative  . No narrative on file    Review of Systems: Constitutional: Negative for fever, chills, diaphoresis, activity change, appetite change and fatigue. HENT: Negative for ear pain, nosebleeds, congestion, facial swelling, rhinorrhea, neck pain, neck stiffness and ear discharge.  Eyes: Negative for pain, discharge, redness, itching and visual disturbance. Respiratory: Negative for  cough, choking, chest tightness, shortness of breath, wheezing and stridor.  Cardiovascular: Negative for chest pain, palpitations and leg swelling. Gastrointestinal: Negative for abdominal distention. Genitourinary: Negative for dysuria, urgency, frequency, hematuria, flank pain, decreased urine volume, difficulty urinating and dyspareunia.  Musculoskeletal: Negative for back pain, joint swelling, arthralgia and gait problem. Neurological: Negative for dizziness, tremors, seizures, syncope, facial asymmetry, speech difficulty, weakness, light-headedness, numbness and headaches.  Hematological: Negative for adenopathy. Does not bruise/bleed easily. Psychiatric/Behavioral: Negative for hallucinations, behavioral problems, confusion, dysphoric mood, decreased  concentration and agitation.    Objective:   Filed Vitals:   10/12/13 1717  BP: 145/90  Pulse:   Temp:   Resp:     Physical Exam: Constitutional: Patient appears well-developed and well-nourished. No distress. HENT: Normocephalic, atraumatic, External right and left ear normal. Oropharynx is clear and moist.  Eyes: Conjunctivae and EOM are normal. PERRLA, no scleral icterus. Neck: Normal ROM. Neck supple. No JVD. No tracheal deviation. No thyromegaly. CVS: RRR, S1/S2 +, no murmurs, no gallops, no carotid bruit.  Pulmonary: Effort and breath sounds normal, no stridor, rhonchi, wheezes, rales.  Abdominal: Soft. BS +, no distension, tenderness, rebound or guarding.  Musculoskeletal: Normal range of motion. No edema and no tenderness.  Neuro: Alert. Normal reflexes, muscle tone coordination. No cranial nerve deficit. Skin: Skin is warm and dry. No rash noted. Not diaphoretic. No erythema. No pallor.  Lab Results  Component Value Date   WBC 3.7* 09/23/2013   HGB 13.6 09/23/2013   HCT 40.3 09/23/2013   MCV 95.7 09/23/2013   PLT 195 09/23/2013   Lab Results  Component Value Date   CREATININE 0.81 09/23/2013   BUN 19 09/23/2013   NA 143 09/23/2013   K 4.3 09/23/2013   CL 105 09/23/2013   CO2 25 09/23/2013    No results found for this basename: HGBA1C   Lipid Panel  No results found for this basename: chol, trig, hdl, cholhdl, vldl, ldlcalc       Assessment and plan:   1. Essential hypertension, benign Medication refill done, advise for low salt diet.  - benazepril (LOTENSIN) 5 MG tablet; Take 1 tablet (5 mg total) by mouth daily.  Dispense: 30 tablet; Refill: 3  2. Unspecified asthma(493.90) Uses albuterol when necessary.  3. Depression/Bipolar disorder, unspecified  Continue to follow with psychiatrist   5. History of MI (myocardial infarction)  - Ambulatory referral to Cardiology  6. Other and unspecified hyperlipidemia  - pravastatin (PRAVACHOL) 10 MG tablet;  Take 1 tablet (10 mg total) by mouth daily.  Dispense: 30 tablet; Refill: 3  7. CAD (coronary artery disease)  - Ambulatory referral to Cardiology - nitroGLYCERIN (NITROSTAT) 0.4 MG SL tablet; Place 1 tablet (0.4 mg total) under the tongue every 5 (five) minutes as needed for chest pain.  Dispense: 30 tablet; Refill: 0 - pravastatin (PRAVACHOL) 10 MG tablet; Take 1 tablet (10 mg total) by mouth daily.  Dispense: 30 tablet; Refill: 3   Return in about 6 weeks (around 11/23/2013).   Doris CheadleADVANI, Shallon Yaklin, MD

## 2013-10-12 NOTE — Patient Instructions (Signed)
2 Gram Low Sodium Diet A 2 gram sodium diet restricts the amount of sodium in the diet to no more than 2 g or 2000 mg daily. Limiting the amount of sodium is often used to help lower blood pressure. It is important if you have heart, liver, or kidney problems. Many foods contain sodium for flavor and sometimes as a preservative. When the amount of sodium in a diet needs to be low, it is important to know what to look for when choosing foods and drinks. The following includes some information and guidelines to help make it easier for you to adapt to a low sodium diet. QUICK TIPS  Do not add salt to food.  Avoid convenience items and fast food.  Choose unsalted snack foods.  Buy lower sodium products, often labeled as "lower sodium" or "no salt added."  Check food labels to learn how much sodium is in 1 serving.  When eating at a restaurant, ask that your food be prepared with less salt or none, if possible. READING FOOD LABELS FOR SODIUM INFORMATION The nutrition facts label is a good place to find how much sodium is in foods. Look for products with no more than 500 to 600 mg of sodium per meal and no more than 150 mg per serving. Remember that 2 g = 2000 mg. The food label may also list foods as:  Sodium-free: Less than 5 mg in a serving.  Very low sodium: 35 mg or less in a serving.  Low-sodium: 140 mg or less in a serving.  Light in sodium: 50% less sodium in a serving. For example, if a food that usually has 300 mg of sodium is changed to become light in sodium, it will have 150 mg of sodium.  Reduced sodium: 25% less sodium in a serving. For example, if a food that usually has 400 mg of sodium is changed to reduced sodium, it will have 300 mg of sodium. CHOOSING FOODS Grains  Avoid: Salted crackers and snack items. Some cereals, including instant hot cereals. Bread stuffing and biscuit mixes. Seasoned rice or pasta mixes.  Choose: Unsalted snack items. Low-sodium cereals, oats,  puffed wheat and rice, shredded wheat. English muffins and bread. Pasta. Meats  Avoid: Salted, canned, smoked, spiced, pickled meats, including fish and poultry. Bacon, ham, sausage, cold cuts, hot dogs, anchovies.  Choose: Low-sodium canned tuna and salmon. Fresh or frozen meat, poultry, and fish. Dairy  Avoid: Processed cheese and spreads. Cottage cheese. Buttermilk and condensed milk. Regular cheese.  Choose: Milk. Low-sodium cottage cheese. Yogurt. Sour cream. Low-sodium cheese. Fruits and Vegetables  Avoid: Regular canned vegetables. Regular canned tomato sauce and paste. Frozen vegetables in sauces. Olives. Pickles. Relishes. Sauerkraut.  Choose: Low-sodium canned vegetables. Low-sodium tomato sauce and paste. Frozen or fresh vegetables. Fresh and frozen fruit. Condiments  Avoid: Canned and packaged gravies. Worcestershire sauce. Tartar sauce. Barbecue sauce. Soy sauce. Steak sauce. Ketchup. Onion, garlic, and table salt. Meat flavorings and tenderizers.  Choose: Fresh and dried herbs and spices. Low-sodium varieties of mustard and ketchup. Lemon juice. Tabasco sauce. Horseradish. SAMPLE 2 GRAM SODIUM MEAL PLAN Breakfast / Sodium (mg)  1 cup low-fat milk / 143 mg  2 slices whole-wheat toast / 270 mg  1 tbs heart-healthy margarine / 153 mg  1 hard-boiled egg / 139 mg  1 small orange / 0 mg Lunch / Sodium (mg)  1 cup raw carrots / 76 mg   cup hummus / 298 mg  1 cup low-fat milk /   143 mg   cup red grapes / 2 mg  1 whole-wheat pita bread / 356 mg Dinner / Sodium (mg)  1 cup whole-wheat pasta / 2 mg  1 cup low-sodium tomato sauce / 73 mg  3 oz lean ground beef / 57 mg  1 small side salad (1 cup raw spinach leaves,  cup cucumber,  cup yellow bell pepper) with 1 tsp olive oil and 1 tsp red wine vinegar / 25 mg Snack / Sodium (mg)  1 container low-fat vanilla yogurt / 107 mg  3 graham cracker squares / 127 mg Nutrient Analysis  Calories: 2033  Protein:  77 g  Carbohydrate: 282 g  Fat: 72 g  Sodium: 1971 mg Document Released: 08/26/2005 Document Revised: 11/18/2011 Document Reviewed: 11/27/2009 [employer] Patient Information 2014 [employer].  

## 2013-10-15 ENCOUNTER — Emergency Department (HOSPITAL_COMMUNITY): Payer: Medicaid Other

## 2013-10-15 ENCOUNTER — Encounter (HOSPITAL_COMMUNITY): Payer: Self-pay | Admitting: Emergency Medicine

## 2013-10-15 ENCOUNTER — Emergency Department (HOSPITAL_COMMUNITY)
Admission: EM | Admit: 2013-10-15 | Discharge: 2013-10-15 | Disposition: A | Discharge status: Home / Self Care | Payer: Medicaid Other | Attending: Emergency Medicine | Admitting: Emergency Medicine

## 2013-10-15 DIAGNOSIS — Z79899 Other long term (current) drug therapy: Secondary | ICD-10-CM | POA: Insufficient documentation

## 2013-10-15 DIAGNOSIS — R5383 Other fatigue: Secondary | ICD-10-CM

## 2013-10-15 DIAGNOSIS — I252 Old myocardial infarction: Secondary | ICD-10-CM | POA: Insufficient documentation

## 2013-10-15 DIAGNOSIS — Z9861 Coronary angioplasty status: Secondary | ICD-10-CM | POA: Insufficient documentation

## 2013-10-15 DIAGNOSIS — I10 Essential (primary) hypertension: Secondary | ICD-10-CM | POA: Insufficient documentation

## 2013-10-15 DIAGNOSIS — R079 Chest pain, unspecified: Secondary | ICD-10-CM

## 2013-10-15 DIAGNOSIS — Z87891 Personal history of nicotine dependence: Secondary | ICD-10-CM | POA: Insufficient documentation

## 2013-10-15 DIAGNOSIS — R5381 Other malaise: Secondary | ICD-10-CM | POA: Insufficient documentation

## 2013-10-15 DIAGNOSIS — R45851 Suicidal ideations: Secondary | ICD-10-CM | POA: Insufficient documentation

## 2013-10-15 DIAGNOSIS — J45901 Unspecified asthma with (acute) exacerbation: Secondary | ICD-10-CM | POA: Insufficient documentation

## 2013-10-15 DIAGNOSIS — F319 Bipolar disorder, unspecified: Secondary | ICD-10-CM | POA: Insufficient documentation

## 2013-10-15 LAB — RAPID URINE DRUG SCREEN, HOSP PERFORMED
AMPHETAMINES: NOT DETECTED
BENZODIAZEPINES: NOT DETECTED
Barbiturates: NOT DETECTED
Cocaine: NOT DETECTED
OPIATES: NOT DETECTED
TETRAHYDROCANNABINOL: NOT DETECTED

## 2013-10-15 LAB — COMPREHENSIVE METABOLIC PANEL
ALBUMIN: 3.7 g/dL (ref 3.5–5.2)
ALT: 27 U/L (ref 0–53)
AST: 28 U/L (ref 0–37)
Alkaline Phosphatase: 68 U/L (ref 39–117)
BUN: 21 mg/dL (ref 6–23)
CO2: 26 mEq/L (ref 19–32)
CREATININE: 0.86 mg/dL (ref 0.50–1.35)
Calcium: 8.8 mg/dL (ref 8.4–10.5)
Chloride: 103 mEq/L (ref 96–112)
GFR calc Af Amer: >90 mL/min (ref >90)
GFR calc non Af Amer: >90 mL/min (ref >90)
Glucose, Bld: 100 mg/dL — ABNORMAL HIGH (ref 70–99)
Potassium: 4.1 mEq/L (ref 3.7–5.3)
Sodium: 141 mEq/L (ref 137–147)
Total Bilirubin: 0.4 mg/dL (ref 0.3–1.2)
Total Protein: 7 g/dL (ref 6.0–8.3)

## 2013-10-15 LAB — CBC WITH DIFFERENTIAL/PLATELET
Basophils Absolute: 0 10*3/uL (ref 0.0–0.1)
Basophils Relative: 1 % (ref 0–1)
EOS PCT: 1 % (ref 0–5)
Eosinophils Absolute: 0.1 10*3/uL (ref 0.0–0.7)
HCT: 39.3 % (ref 39.0–52.0)
Hemoglobin: 13.2 g/dL (ref 13.0–17.0)
LYMPHS ABS: 2.1 10*3/uL (ref 0.7–4.0)
LYMPHS PCT: 53 % — AB (ref 12–46)
MCH: 31.9 pg (ref 26.0–34.0)
MCHC: 33.6 g/dL (ref 30.0–36.0)
MCV: 94.9 fL (ref 78.0–100.0)
Monocytes Absolute: 0.3 10*3/uL (ref 0.1–1.0)
Monocytes Relative: 7 % (ref 3–12)
NEUTROS ABS: 1.5 10*3/uL — AB (ref 1.7–7.7)
Neutrophils Relative %: 39 % — ABNORMAL LOW (ref 43–77)
PLATELETS: 198 10*3/uL (ref 150–400)
RBC: 4.14 MIL/uL — AB (ref 4.22–5.81)
RDW: 12.5 % (ref 11.5–15.5)
WBC: 4 10*3/uL (ref 4.0–10.5)

## 2013-10-15 LAB — POCT I-STAT TROPONIN I: Troponin i, poc: 0.01 ng/mL (ref 0.00–0.08)

## 2013-10-15 NOTE — Discharge Instructions (Signed)
Chest Pain (Nonspecific) °It is often hard to give a specific diagnosis for the cause of chest pain. There is always a chance that your pain could be related to something serious, such as a heart attack or a blood clot in the lungs. You need to follow up with your caregiver for further evaluation. °CAUSES  °· Heartburn. °· Pneumonia or bronchitis. °· Anxiety or stress. °· Inflammation around your heart (pericarditis) or lung (pleuritis or pleurisy). °· A blood clot in the lung. °· A collapsed lung (pneumothorax). It can develop suddenly on its own (spontaneous pneumothorax) or from injury (trauma) to the chest. °· Shingles infection (herpes zoster virus). °The chest wall is composed of bones, muscles, and cartilage. Any of these can be the source of the pain. °· The bones can be bruised by injury. °· The muscles or cartilage can be strained by coughing or overwork. °· The cartilage can be affected by inflammation and become sore (costochondritis). °DIAGNOSIS  °Lab tests or other studies, such as X-rays, electrocardiography, stress testing, or cardiac imaging, may be needed to find the cause of your pain.  °TREATMENT  °· Treatment depends on what may be causing your chest pain. Treatment may include: °· Acid blockers for heartburn. °· Anti-inflammatory medicine. °· Pain medicine for inflammatory conditions. °· Antibiotics if an infection is present. °· You may be advised to change lifestyle habits. This includes stopping smoking and avoiding alcohol, caffeine, and chocolate. °· You may be advised to keep your head raised (elevated) when sleeping. This reduces the chance of acid going backward from your stomach into your esophagus. °· Most of the time, nonspecific chest pain will improve within 2 to 3 days with rest and mild pain medicine. °HOME CARE INSTRUCTIONS  °· If antibiotics were prescribed, take your antibiotics as directed. Finish them even if you start to feel better. °· For the next few days, avoid physical  activities that bring on chest pain. Continue physical activities as directed. °· Do not smoke. °· Avoid drinking alcohol. °· Only take over-the-counter or prescription medicine for pain, discomfort, or fever as directed by your caregiver. °· Follow your caregiver's suggestions for further testing if your chest pain does not go away. °· Keep any follow-up appointments you made. If you do not go to an appointment, you could develop lasting (chronic) problems with pain. If there is any problem keeping an appointment, you must call to reschedule. °SEEK MEDICAL CARE IF:  °· You think you are having problems from the medicine you are taking. Read your medicine instructions carefully. °· Your chest pain does not go away, even after treatment. °· You develop a rash with blisters on your chest. °SEEK IMMEDIATE MEDICAL CARE IF:  °· You have increased chest pain or pain that spreads to your arm, neck, jaw, back, or abdomen. °· You develop shortness of breath, an increasing cough, or you are coughing up blood. °· You have severe back or abdominal pain, feel nauseous, or vomit. °· You develop severe weakness, fainting, or chills. °· You have a fever. °THIS IS AN EMERGENCY. Do not wait to see if the pain will go away. Get medical help at once. Call your local emergency services (911 in [place] Do not drive yourself to the hospital. °MAKE SURE YOU:  °· Understand these instructions. °· Will watch your condition. °· Will get help right away if you are not doing well or get worse. °Document Released: 06/05/2005 Document Revised: 11/18/2011 Document Reviewed: 03/31/2008 °ExitCare® Patient Information ©2014 [employer]   [employer]. ° °

## 2013-10-15 NOTE — ED Provider Notes (Signed)
CSN: [id number]     Arrival date & time 10/15/13  0515 History   First MD Initiated Contact with Patient 10/15/13 [phone]     Chief Complaint  Patient presents with  . Chest Pain  . Shortness of Breath  . Suicidal   (Consider location/radiation/quality/duration/timing/severity/associated sxs/prior Treatment) HPI This patient is a 48 yo man with a self reported history of HTN and history MI who is BIB EMS from the shelter where he resides. He says he has had two days of intermittent right sided chest pain. He says he has had this pain frequently since he had an MI in 2007.  He denies SOB. No cough. No fever. No abd pain. No pain at this time. Pain is mild, burning and nonradiating. Nothing makes it worse or better.   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 He says he feels like everything will be OK if he can just get a note excusing him from work today.   The patient also states that he has been evaluated at this hospital on multiple previous occasions for chest pain. However, I see evidence of only two previous ED visits. Patient denies any recent cocaine use.   Past Medical History  Diagnosis Date  . Heart attack   . Hypertension   . Bipolar 1 disorder   . Asthma   . Bronchitis    Past Surgical History  Procedure Laterality Date  . Coronary stent placement     Family History  Problem Relation Age of Onset  . Heart disease Mother   . Heart disease Father   . Stroke Brother   . Cancer Maternal Grandmother     bone cancer    History  Substance Use Topics  . Smoking status: Former Games developer  . Smokeless tobacco: Not on file  . Alcohol Use: No    Review of Systems Ten point review of symptoms performed and is negative with the exception of symptoms noted above.   Allergies  Tylenol  Home Medications   Current Outpatient Rx  Name  Route  Sig  Dispense  Refill  . albuterol (PROVENTIL HFA;VENTOLIN HFA) 108  (90 BASE) MCG/ACT inhaler   Inhalation   Inhale into the lungs every 6 (six) hours as needed for wheezing or shortness of breath.         . benazepril (LOTENSIN) 5 MG tablet   Oral   Take 1 tablet (5 mg total) by mouth daily.   30 tablet   3   . benztropine (COGENTIN) 0.5 MG tablet   Oral   Take 0.5 mg by mouth 2 (two) times daily.         . clonazePAM (KLONOPIN) 0.5 MG tablet   Oral   Take 0.5 mg by mouth 2 (two) times daily as needed for anxiety.         . divalproex (DEPAKOTE) 500 MG DR tablet   Oral   Take 500 mg by mouth 3 (three) times daily.         Marland Kitchen FLUoxetine (PROZAC) 10 MG capsule   Oral   Take 10 mg by mouth daily.         . haloperidol (HALDOL) 5 MG tablet   Oral   Take 5 mg by mouth 2 (two) times daily.         . methocarbamol (ROBAXIN) 500 MG tablet   Oral   Take  1 tablet (500 mg total) by mouth 2 (two) times daily.   20 tablet   0   . nitroGLYCERIN (NITROSTAT) 0.4 MG SL tablet   Sublingual   Place 1 tablet (0.4 mg total) under the tongue every 5 (five) minutes as needed for chest pain.   30 tablet   0   . pravastatin (PRAVACHOL) 10 MG tablet   Oral   Take 1 tablet (10 mg total) by mouth daily.   30 tablet   3   . traZODone (DESYREL) 100 MG tablet   Oral   Take 100 mg by mouth at bedtime.          BP 128/86  Temp(Src) 98.1 F (36.7 C) (Oral)  Resp 12  Ht 5\' 5"  (1.651 m)  Wt 160 lb (72.576 kg)  BMI 26.63 kg/m2  SpO2 100% Physical Exam Gen: well developed and well nourished appearing Head: NCAT Eyes: PERL, EOMI Nose: no epistaixis or rhinorrhea Mouth/throat: mucosa is moist and pink Neck: supple, no stridor Lungs: CTA B, no wheezing, rhonchi or rales CV: RRR, no murmur, extremities appear well perfused.  Abd: soft, notender, nondistended Back: no ttp, no cva ttp Skin: warm and dry Ext: normal to inspection, no dependent edema Neuro: CN ii-xii grossly intact, no focal deficits Psyche; normal affect,  calm and  cooperative.   ED Course  Procedures (including critical care time) Labs Review  Results for orders placed during the hospital encounter of 10/15/13 (from the past 24 hour(s))  COMPREHENSIVE METABOLIC PANEL     Status: Abnormal   Collection Time    10/15/13  6:08 AM      Result Value Range   Sodium 141  137 - 147 mEq/L   Potassium 4.1  3.7 - 5.3 mEq/L   Chloride 103  96 - 112 mEq/L   CO2 26  19 - 32 mEq/L   Glucose, Bld 100 (*) 70 - 99 mg/dL   BUN 21  6 - 23 mg/dL   Creatinine, Ser 5.05  0.50 - 1.35 mg/dL   Calcium 8.8  8.4 - 39.7 mg/dL   Total Protein 7.0  6.0 - 8.3 g/dL   Albumin 3.7  3.5 - 5.2 g/dL   AST 28  0 - 37 U/L   ALT 27  0 - 53 U/L   Alkaline Phosphatase 68  39 - 117 U/L   Total Bilirubin 0.4  0.3 - 1.2 mg/dL   GFR calc non Af Amer >90  >90 mL/min   GFR calc Af Amer >90  >90 mL/min  CBC WITH DIFFERENTIAL     Status: Abnormal   Collection Time    10/15/13  6:08 AM      Result Value Range   WBC 4.0  4.0 - 10.5 K/uL   RBC 4.14 (*) 4.22 - 5.81 MIL/uL   Hemoglobin 13.2  13.0 - 17.0 g/dL   HCT 67.3  41.9 - 37.9 %   MCV 94.9  78.0 - 100.0 fL   MCH 31.9  26.0 - 34.0 pg   MCHC 33.6  30.0 - 36.0 g/dL   RDW 02.4  09.7 - 35.3 %   Platelets 198  150 - 400 K/uL   Neutrophils Relative % 39 (*) 43 - 77 %   Neutro Abs 1.5 (*) 1.7 - 7.7 K/uL   Lymphocytes Relative 53 (*) 12 - 46 %   Lymphs Abs 2.1  0.7 - 4.0 K/uL   Monocytes Relative 7  3 - 12 %  Monocytes Absolute 0.3  0.1 - 1.0 K/uL   Eosinophils Relative 1  0 - 5 %   Eosinophils Absolute 0.1  0.0 - 0.7 K/uL   Basophils Relative 1  0 - 1 %   Basophils Absolute 0.0  0.0 - 0.1 K/uL  POCT I-STAT TROPONIN I     Status: None   Collection Time    10/15/13  6:14 AM      Result Value Range   Troponin i, poc 0.01  0.00 - 0.08 ng/mL   Comment 3            CXR: normal cardiac silloute, normal appearing mediastinum, no infiltrates, no acute process identified.   EKG: nsr, normal qrs, normal axis, normal intervals, normal  rate, no ST-T segment abnormalities.   MDM  ED work up is non-diagnostic in the face of several days of chronic chest pain. Patient asks for work note and it appears that it is his primary reason for ED visit. He is stable for d/c with plan [facility].     Brandt LoosenJulie Manly, MD 10/15/13 [phone]

## 2013-10-15 NOTE — ED Notes (Signed)
Pt reports, SOB and right sided CP x 2 days. Pt reports Hx of asthma. Pt also reports vomiting and diarrhea. Pt reports that he was seeing voices and hearing things last night.

## 2013-10-15 NOTE — ED Notes (Signed)
Pt given d/c instructions and verbalized understanding. MD Lavella Lemons aware pt had SI/HI thought last night and has put a referral to [facility] as needed if his symptoms worsen.

## 2013-10-15 NOTE — ED Notes (Signed)
Patient transported to X-ray 

## 2013-10-15 NOTE — ED Notes (Addendum)
Pt placed in paper scrubs and wanded by security. Research scientist (physical sciences) at the bedside. MD notified of pts SI/HI ideations. House coverage notified.

## 2013-10-20 ENCOUNTER — Encounter: Payer: Self-pay | Admitting: Cardiology

## 2013-10-20 ENCOUNTER — Ambulatory Visit: Payer: Medicaid Other | Attending: Cardiology | Admitting: Cardiology

## 2013-10-20 DIAGNOSIS — I10 Essential (primary) hypertension: Secondary | ICD-10-CM | POA: Insufficient documentation

## 2013-10-20 DIAGNOSIS — I2510 Atherosclerotic heart disease of native coronary artery without angina pectoris: Secondary | ICD-10-CM | POA: Insufficient documentation

## 2013-10-20 DIAGNOSIS — R0789 Other chest pain: Secondary | ICD-10-CM | POA: Insufficient documentation

## 2013-10-20 DIAGNOSIS — Z87891 Personal history of nicotine dependence: Secondary | ICD-10-CM | POA: Insufficient documentation

## 2013-10-20 DIAGNOSIS — F319 Bipolar disorder, unspecified: Secondary | ICD-10-CM | POA: Insufficient documentation

## 2013-10-20 MED ORDER — CARVEDILOL 6.25 MG PO TABS: 6.2500 mg | ORAL_TABLET | Freq: Two times a day (BID) | ORAL | Status: DC

## 2013-10-20 MED ORDER — BENAZEPRIL HCL 10 MG PO TABS: 10.0000 mg | ORAL_TABLET | Freq: Every day | ORAL | Status: DC

## 2013-10-20 NOTE — Progress Notes (Signed)
Pt here to f/u Dr. Daleen Squibb for Hx MI 2009 with stent C/o intermit CP since stent placement Recently seen in ER 2/6  Last episode last night radiating to mid sternal which has resolved

## 2013-10-20 NOTE — Progress Notes (Signed)
HPI [name] comes today for evaluation of recurrent chest discomfort and history of coronary artery disease. He apparently had a myocardial infarction and [place] in [place]. He has recurrent sharp stabbing pain over his heart both at rest and with activity. He does not have any ischemic symptoms. He established here in our office on February 3 but then showed up in the emergency room on the sixth of chest discomfort. Troponins were negative and EKG was unremarkable. Drug screen was negative. Chest x-ray showed a generous heart but otherwise negative.  He denies orthopnea, PND. He does occasionally have dependent edema. His blood pressures been poorly controlled and the ACE inhibitor was started at a low dose on the third. He is on a beta blocker. Blood work was fairly unremarkable. He is on a statin.  Past Medical History  Diagnosis Date  . Heart attack   . Hypertension   . Bipolar 1 disorder   . Asthma   . Bronchitis     Current Outpatient Prescriptions  Medication Sig Dispense Refill  . benazepril (LOTENSIN) 10 MG tablet Take 1 tablet (10 mg total) by mouth daily.  30 tablet  3  . benztropine (COGENTIN) 0.5 MG tablet Take 0.5 mg by mouth 2 (two) times daily.      . divalproex (DEPAKOTE) 500 MG DR tablet Take 500 mg by mouth 3 (three) times daily.      Marland Kitchen. FLUoxetine (PROZAC) 10 MG capsule Take 10 mg by mouth daily.      . haloperidol (HALDOL) 5 MG tablet Take 5 mg by mouth 2 (two) times daily.      . pravastatin (PRAVACHOL) 10 MG tablet Take 1 tablet (10 mg total) by mouth daily.  30 tablet  3  . traZODone (DESYREL) 100 MG tablet Take 100 mg by mouth at bedtime.      Marland Kitchen. albuterol (PROVENTIL HFA;VENTOLIN HFA) 108 (90 BASE) MCG/ACT inhaler Inhale into the lungs every 6 (six) hours as needed for wheezing or shortness of breath.      . carvedilol (COREG) 6.25 MG tablet Take 1 tablet (6.25 mg total) by mouth 2 (two) times daily with a meal.  60 tablet  3  .  clonazePAM (KLONOPIN) 0.5 MG tablet Take 0.5 mg by mouth 2 (two) times daily as needed for anxiety.      . methocarbamol (ROBAXIN) 500 MG tablet Take 1 tablet (500 mg total) by mouth 2 (two) times daily.  20 tablet  0  . nitroGLYCERIN (NITROSTAT) 0.4 MG SL tablet Place 1 tablet (0.4 mg total) under the tongue every 5 (five) minutes as needed for chest pain.  30 tablet  0   No current facility-administered medications for this visit.    Allergies  Allergen Reactions  . Tylenol [Acetaminophen] Other (See Comments)    Increases heart rate     Family History  Problem Relation Age of Onset  . Heart disease Mother   . Heart disease Father   . Stroke Brother   . Cancer Maternal Grandmother     bone cancer     History   Social History  . Marital Status: Single    Spouse Name: N/A    Number of Children: N/A  . Years of Education: N/A   Occupational History  . Not on file.   Social History Main Topics  . Smoking status: Former Games developermoker  . Smokeless tobacco: Not on file  . Alcohol Use: No  . Drug Use:  No  . Sexual Activity: Not on file   Other Topics Concern  . Not on file   Social History Narrative  . No narrative on file    ROS ALL NEGATIVE EXCEPT THOSE NOTED IN HPI  PE  General Appearance: well developed, well nourished in no acute distress, muscular HEENT: symmetrical face, PERRLA, good dentition  Neck: no JVD, thyromegaly, or adenopathy, trachea midline Chest: symmetric without deformity Cardiac: PMI non-displaced, RRR, normal S1, S2, no gallop , 2/6 systolic murmur left sternal border. S2 splits. No diastolic component Lung: clear to ausculation and percussion Vascular: all pulses full without bruits  Abdominal: nondistended, nontender, good bowel sounds, no HSM, no bruits Extremities: no cyanosis, clubbing or edema, no sign of DVT, no varicosities  Skin: normal color, no rashes Neuro: alert and oriented x 3, non-focal Pysch: normal affect  EKG Not  repeated  BMET    Component Value Date/Time   NA 141 10/15/2013 0608   K 4.1 10/15/2013 0608   CL 103 10/15/2013 0608   CO2 26 10/15/2013 0608   GLUCOSE 100* 10/15/2013 0608   BUN 21 10/15/2013 0608   CREATININE 0.86 10/15/2013 0608   CALCIUM 8.8 10/15/2013 0608   GFRNONAA >90 10/15/2013 0608   GFRAA >90 10/15/2013 0608    Lipid Panel  No results found for this basename: chol, trig, hdl, cholhdl, vldl, ldlcalc    CBC    Component Value Date/Time   WBC 4.0 10/15/2013 0608   RBC 4.14* 10/15/2013 0608   HGB 13.2 10/15/2013 0608   HCT 39.3 10/15/2013 0608   PLT 198 10/15/2013 0608   MCV 94.9 10/15/2013 0608   MCH 31.9 10/15/2013 0608   MCHC 33.6 10/15/2013 0608   RDW 12.5 10/15/2013 0608   LYMPHSABS 2.1 10/15/2013 0608   MONOABS 0.3 10/15/2013 0608   EOSABS 0.1 10/15/2013 0608   BASOSABS 0.0 10/15/2013 6381

## 2013-11-17 ENCOUNTER — Encounter: Payer: Self-pay | Admitting: Internal Medicine

## 2013-11-17 ENCOUNTER — Ambulatory Visit: Payer: No Typology Code available for payment source | Attending: Internal Medicine | Admitting: Internal Medicine

## 2013-11-17 VITALS — BP 125/87 | HR 76 | Temp 98.2°F | Resp 16 | Wt 165.6 lb

## 2013-11-17 DIAGNOSIS — F3289 Other specified depressive episodes: Secondary | ICD-10-CM

## 2013-11-17 DIAGNOSIS — I2510 Atherosclerotic heart disease of native coronary artery without angina pectoris: Secondary | ICD-10-CM

## 2013-11-17 DIAGNOSIS — R51 Headache: Secondary | ICD-10-CM

## 2013-11-17 DIAGNOSIS — F329 Major depressive disorder, single episode, unspecified: Secondary | ICD-10-CM

## 2013-11-17 DIAGNOSIS — F32A Depression, unspecified: Secondary | ICD-10-CM

## 2013-11-17 DIAGNOSIS — E785 Hyperlipidemia, unspecified: Secondary | ICD-10-CM

## 2013-11-17 DIAGNOSIS — I10 Essential (primary) hypertension: Secondary | ICD-10-CM

## 2013-11-17 MED ORDER — BENAZEPRIL HCL 10 MG PO TABS: 10.0000 mg | ORAL_TABLET | Freq: Every day | ORAL | Status: DC

## 2013-11-17 MED ORDER — ALBUTEROL SULFATE HFA 108 (90 BASE) MCG/ACT IN AERS: 2.0000 | INHALATION_SPRAY | Freq: Four times a day (QID) | RESPIRATORY_TRACT | Status: AC | PRN

## 2013-11-17 MED ORDER — PRAVASTATIN SODIUM 10 MG PO TABS: 10.0000 mg | ORAL_TABLET | Freq: Every day | ORAL | Status: DC

## 2013-11-17 MED ORDER — CARVEDILOL 6.25 MG PO TABS: 6.2500 mg | ORAL_TABLET | Freq: Two times a day (BID) | ORAL | Status: DC

## 2013-11-17 MED ORDER — BUTALBITAL-APAP-CAFFEINE 50-325-40 MG PO TABS: 1.0000 | ORAL_TABLET | Freq: Four times a day (QID) | ORAL | Status: DC | PRN

## 2013-11-17 MED ORDER — NITROGLYCERIN 0.4 MG SL SUBL: 0.4000 mg | SUBLINGUAL_TABLET | SUBLINGUAL | Status: DC | PRN

## 2013-11-17 NOTE — Progress Notes (Signed)
Patient here for follow up Needs medication refills Complains of headache and some dizzy spells States he had a concussion ten years ago and would like medication Prescribed for his headache

## 2013-11-17 NOTE — Progress Notes (Signed)
MRN: [id number] Name: Jesse Watkins  Sex: male Age: 48 y.o. DOB: Jun 14, 1966  Allergies: Tylenol  Chief Complaint  Patient presents with  . medication refills    HPI: Patient is 48 y.o. male who comes today for medication refill history of hypertension, CAD following up with cardiologist his blood pressure medication was up titrated and was started on Coreg as per patient he has been taking his medication denies any chest pain occasionally has shortness of breath at that time he used albuterol which helps him with the symptoms, also reported to have headaches for years, he showed me the medication that he was taking Fioricet, requesting refill on the medication. Has history of depression/bipolar disorder following up with the psychiatrist.  Past Medical History  Diagnosis Date  . Heart attack   . Hypertension   . Bipolar 1 disorder   . Asthma   . Bronchitis     Past Surgical History  Procedure Laterality Date  . Coronary stent placement        Medication List       This list is accurate as of: 11/17/13  2:28 PM.  Always use your most recent med list.               albuterol 108 (90 BASE) MCG/ACT inhaler  Commonly known as:  PROVENTIL HFA;VENTOLIN HFA  Inhale 2 puffs into the lungs every 6 (six) hours as needed for wheezing or shortness of breath.     aspirin 325 MG tablet  Take 325 mg by mouth daily.     benazepril 10 MG tablet  Commonly known as:  LOTENSIN  Take 1 tablet (10 mg total) by mouth daily.     benztropine 0.5 MG tablet  Commonly known as:  COGENTIN  Take 0.5 mg by mouth 2 (two) times daily.     butalbital-acetaminophen-caffeine 50-325-40 MG per tablet  Commonly known as:  ESGIC  Take 1 tablet by mouth every 6 (six) hours as needed for headache.     carvedilol 6.25 MG tablet  Commonly known as:  COREG  Take 1 tablet (6.25 mg total) by mouth 2 (two) times daily with a meal.     clonazePAM 0.5 MG tablet  Commonly known as:  KLONOPIN  Take  0.5 mg by mouth 2 (two) times daily as needed for anxiety.     divalproex 500 MG DR tablet  Commonly known as:  DEPAKOTE  Take 500 mg by mouth 3 (three) times daily.     FLUoxetine 10 MG capsule  Commonly known as:  PROZAC  Take 10 mg by mouth daily.     haloperidol 5 MG tablet  Commonly known as:  HALDOL  Take 5 mg by mouth 2 (two) times daily.     methocarbamol 500 MG tablet  Commonly known as:  ROBAXIN  Take 1 tablet (500 mg total) by mouth 2 (two) times daily.     nitroGLYCERIN 0.4 MG SL tablet  Commonly known as:  NITROSTAT  Place 1 tablet (0.4 mg total) under the tongue every 5 (five) minutes as needed for chest pain.     pravastatin 10 MG tablet  Commonly known as:  PRAVACHOL  Take 1 tablet (10 mg total) by mouth daily.     traZODone 100 MG tablet  Commonly known as:  DESYREL  Take 100 mg by mouth at bedtime.        Meds ordered this encounter  Medications  . albuterol (PROVENTIL HFA;VENTOLIN HFA) 108 (90  BASE) MCG/ACT inhaler    Sig: Inhale 2 puffs into the lungs every 6 (six) hours as needed for wheezing or shortness of breath.    Dispense:  1 Inhaler    Refill:  2  . benazepril (LOTENSIN) 10 MG tablet    Sig: Take 1 tablet (10 mg total) by mouth daily.    Dispense:  30 tablet    Refill:  3  . carvedilol (COREG) 6.25 MG tablet    Sig: Take 1 tablet (6.25 mg total) by mouth 2 (two) times daily with a meal.    Dispense:  60 tablet    Refill:  3  . nitroGLYCERIN (NITROSTAT) 0.4 MG SL tablet    Sig: Place 1 tablet (0.4 mg total) under the tongue every 5 (five) minutes as needed for chest pain.    Dispense:  30 tablet    Refill:  0  . pravastatin (PRAVACHOL) 10 MG tablet    Sig: Take 1 tablet (10 mg total) by mouth daily.    Dispense:  30 tablet    Refill:  3  . butalbital-acetaminophen-caffeine (ESGIC) 50-325-40 MG per tablet    Sig: Take 1 tablet by mouth every 6 (six) hours as needed for headache.    Dispense:  30 tablet    Refill:  0     There is  no immunization history on file for this patient.  Family History  Problem Relation Age of Onset  . Heart disease Mother   . Heart disease Father   . Stroke Brother   . Cancer Maternal Grandmother     bone cancer     History  Substance Use Topics  . Smoking status: Former Games developermoker  . Smokeless tobacco: Not on file  . Alcohol Use: No    Review of Systems   As noted in HPI  Filed Vitals:   11/17/13 1411  BP: 125/87  Pulse: 76  Temp: 98.2 F (36.8 C)  Resp: 16    Physical Exam  Physical Exam  Eyes: EOM are normal. Pupils are equal, round, and reactive to light.  Cardiovascular: Normal rate and regular rhythm.   Pulmonary/Chest: Breath sounds normal. No respiratory distress. He has no wheezes. He has no rales.  Neurological:  Equal strength all extremities     CBC    Component Value Date/Time   WBC 4.0 10/15/2013 0608   RBC 4.14* 10/15/2013 0608   HGB 13.2 10/15/2013 0608   HCT 39.3 10/15/2013 0608   PLT 198 10/15/2013 0608   MCV 94.9 10/15/2013 0608   LYMPHSABS 2.1 10/15/2013 0608   MONOABS 0.3 10/15/2013 0608   EOSABS 0.1 10/15/2013 0608   BASOSABS 0.0 10/15/2013 0608    CMP     Component Value Date/Time   NA 141 10/15/2013 0608   K 4.1 10/15/2013 0608   CL 103 10/15/2013 0608   CO2 26 10/15/2013 0608   GLUCOSE 100* 10/15/2013 0608   BUN 21 10/15/2013 0608   CREATININE 0.86 10/15/2013 0608   CALCIUM 8.8 10/15/2013 0608   PROT 7.0 10/15/2013 0608   ALBUMIN 3.7 10/15/2013 0608   AST 28 10/15/2013 0608   ALT 27 10/15/2013 0608   ALKPHOS 68 10/15/2013 0608   BILITOT 0.4 10/15/2013 0608   GFRNONAA >90 10/15/2013 0608   GFRAA >90 10/15/2013 0608    No results found for this basename: chol, tri, ldl    No components found with this basename: hga1c    Lab Results  Component Value Date/Time  AST 28 10/15/2013  6:08 AM    Assessment and Plan  Essential hypertension, benign - Plan: Blood pressure is well controlled continue with Coreg and  benazepril   (LOTENSIN) 10 MG tablet  CAD (coronary  artery disease) - Plan: carvedilol (COREG) 6.25 MG tablet, nitroGLYCERIN (NITROSTAT) 0.4 MG SL tablet, pravastatin (PRAVACHOL) 10 MG tablet  Other and unspecified hyperlipidemia - Plan: pravastatin (PRAVACHOL) 10 MG tablet  Depression Following up with the psychiatrist  Headache(784.0) Fioricet when necessary   Return in about 3 months (around 02/17/2014) for hypertension.  Doris Cheadle, MD

## 2014-01-12 ENCOUNTER — Encounter: Payer: Self-pay | Admitting: Cardiology

## 2014-01-12 ENCOUNTER — Other Ambulatory Visit: Payer: Self-pay | Admitting: Internal Medicine

## 2014-01-12 ENCOUNTER — Ambulatory Visit: Payer: No Typology Code available for payment source | Attending: Cardiology | Admitting: Cardiology

## 2014-01-12 VITALS — BP 135/91 | HR 72 | Temp 98.1°F | Resp 16 | Ht 69.0 in | Wt 169.0 lb

## 2014-01-12 DIAGNOSIS — I2510 Atherosclerotic heart disease of native coronary artery without angina pectoris: Secondary | ICD-10-CM | POA: Insufficient documentation

## 2014-01-12 MED ORDER — ASPIRIN 325 MG PO TABS: 325.0000 mg | ORAL_TABLET | Freq: Every day | ORAL | Status: AC

## 2014-01-12 MED ORDER — NITROGLYCERIN 0.4 MG SL SUBL: 0.4000 mg | SUBLINGUAL_TABLET | SUBLINGUAL | Status: AC | PRN

## 2014-01-12 NOTE — Assessment & Plan Note (Signed)
Stable. Continue secondary preventative therapy. Meds refilled. Return to my clinic in a year.

## 2014-01-12 NOTE — Patient Instructions (Signed)
Continue taking prescribed medications daily and return with Dr. Daleen Squibb in 1 year, if no cardiac problems Medication refills sent to pharmacy

## 2014-01-12 NOTE — Progress Notes (Signed)
Jesse Watkins returns today for followup of his history of coronary artery disease. Last visit, he was having sharp stabbing pains in his chest that did not sound ischemic. He continues to have these. He says he worries a lot about having problems in the future with his heart. He seems to be compliant with his medications.  He wants renewal of his nitroglycerin.  His examination today is unchanged. No studies repeated.

## 2014-01-29 ENCOUNTER — Emergency Department (INDEPENDENT_AMBULATORY_CARE_PROVIDER_SITE_OTHER)
Admission: EM | Admit: 2014-01-29 | Discharge: 2014-01-29 | Disposition: A | Discharge status: Home / Self Care | Payer: Medicaid Other | Source: Home / Self Care | Attending: Family Medicine | Admitting: Family Medicine

## 2014-01-29 ENCOUNTER — Encounter (HOSPITAL_COMMUNITY): Payer: Self-pay | Admitting: Emergency Medicine

## 2014-01-29 DIAGNOSIS — K529 Noninfective gastroenteritis and colitis, unspecified: Secondary | ICD-10-CM

## 2014-01-29 DIAGNOSIS — K5289 Other specified noninfective gastroenteritis and colitis: Secondary | ICD-10-CM

## 2014-01-29 MED ORDER — ONDANSETRON HCL 4 MG/2ML IJ SOLN
INTRAMUSCULAR | Status: AC
  Filled 2014-01-29: qty 2

## 2014-01-29 MED ORDER — ONDANSETRON HCL 4 MG PO TABS: 4.0000 mg | ORAL_TABLET | Freq: Four times a day (QID) | ORAL | Status: DC

## 2014-01-29 MED ORDER — SODIUM CHLORIDE 0.9 % IV SOLN
Freq: Once | INTRAVENOUS | Status: AC
  Administered 2014-01-29: 15:00:00 via INTRAVENOUS

## 2014-01-29 MED ORDER — ONDANSETRON HCL 4 MG/2ML IJ SOLN: 4.0000 mg | Freq: Once | INTRAMUSCULAR | Status: DC

## 2014-01-29 MED ORDER — ONDANSETRON HCL 4 MG/2ML IJ SOLN
4.0000 mg | Freq: Once | INTRAMUSCULAR | Status: AC
  Administered 2014-01-29: 4 mg via INTRAVENOUS

## 2014-01-29 NOTE — ED Provider Notes (Signed)
CSN: [id number]     Arrival date & time 01/29/14  1354 History   First MD Initiated Contact with Patient 01/29/14 1511     Chief Complaint  Patient presents with  . Abdominal Pain   (Consider location/radiation/quality/duration/timing/severity/associated sxs/prior Treatment) Patient is a 48 y.o. male presenting with vomiting. The history is provided by the patient.  Emesis Severity:  Moderate Duration:  8 hours Quality:  Stomach contents Progression:  Unchanged Chronicity:  New Recent urination:  Normal Relieved by:  None tried Worsened by:  Nothing tried Associated symptoms: chills and diarrhea   Associated symptoms: no abdominal pain and no fever   Risk factors: no sick contacts and no suspect food intake     Past Medical History  Diagnosis Date  . Heart attack   . Hypertension   . Bipolar 1 disorder   . Asthma   . Bronchitis    Past Surgical History  Procedure Laterality Date  . Coronary stent placement     Family History  Problem Relation Age of Onset  . Heart disease Mother   . Heart disease Father   . Stroke Brother   . Cancer Maternal Grandmother     bone cancer    History  Substance Use Topics  . Smoking status: Former Games developermoker  . Smokeless tobacco: Not on file  . Alcohol Use: No    Review of Systems  Constitutional: Positive for chills.  Gastrointestinal: Positive for nausea, vomiting and diarrhea. Negative for abdominal pain and blood in stool.    Allergies  Tylenol  Home Medications   Prior to Admission medications   Medication Sig Start Date End Date Taking? Authorizing Provider  albuterol (PROVENTIL HFA;VENTOLIN HFA) 108 (90 BASE) MCG/ACT inhaler Inhale 2 puffs into the lungs every 6 (six) hours as needed for wheezing or shortness of breath. 11/17/13   Doris Cheadleeepak Advani, MD  aspirin 325 MG tablet Take 1 tablet (325 mg total) by mouth daily. 01/12/14   Gaylord Shihhomas C Wall, MD  benazepril (LOTENSIN) 10 MG tablet Take 1 tablet (10 mg total) by mouth daily.  11/17/13   Doris Cheadleeepak Advani, MD  benztropine (COGENTIN) 0.5 MG tablet Take 0.5 mg by mouth 2 (two) times daily.    Historical Provider, MD  butalbital-acetaminophen-caffeine (FIORICET, ESGIC) 50-325-40 MG per tablet TAKE ONE TABLET BY MOUTH EVERY SIX HOURS FOR HEADCHE    Deepak Advani, MD  carvedilol (COREG) 6.25 MG tablet Take 1 tablet (6.25 mg total) by mouth 2 (two) times daily with a meal. 11/17/13   Doris Cheadleeepak Advani, MD  clonazePAM (KLONOPIN) 0.5 MG tablet Take 0.5 mg by mouth 2 (two) times daily as needed for anxiety.    Historical Provider, MD  divalproex (DEPAKOTE) 500 MG DR tablet Take 500 mg by mouth 3 (three) times daily.    Historical Provider, MD  FLUoxetine (PROZAC) 10 MG capsule Take 10 mg by mouth daily.    Historical Provider, MD  haloperidol (HALDOL) 5 MG tablet Take 5 mg by mouth 2 (two) times daily.    Historical Provider, MD  methocarbamol (ROBAXIN) 500 MG tablet Take 1 tablet (500 mg total) by mouth 2 (two) times daily. 08/29/13   Loren Raceravid Yelverton, MD  nitroGLYCERIN (NITROSTAT) 0.4 MG SL tablet Place 1 tablet (0.4 mg total) under the tongue every 5 (five) minutes as needed for chest pain. 01/12/14   Gaylord Shihhomas C Wall, MD  pravastatin (PRAVACHOL) 10 MG tablet Take 1 tablet (10 mg total) by mouth daily. 11/17/13   Doris Cheadleeepak Advani, MD  traZODone (  DESYREL) 100 MG tablet Take 100 mg by mouth at bedtime.    Historical Provider, MD   BP 116/79  Pulse 86  Temp(Src) 97.8 F (36.6 C) (Oral)  Resp 16  SpO2 100% Physical Exam  Nursing note and vitals reviewed. Constitutional: He is oriented to person, place, and time. He appears well-developed and well-nourished.  HENT:  Mouth/Throat: Oropharynx is clear and moist.  Eyes: Conjunctivae are normal. Pupils are equal, round, and reactive to light.  Neck: Normal range of motion. Neck supple.  Pulmonary/Chest: Breath sounds normal.  Abdominal: Soft. Bowel sounds are normal. He exhibits no distension. There is no tenderness. There is no rebound.   Lymphadenopathy:    He has no cervical adenopathy.  Neurological: He is alert and oriented to person, place, and time.  Skin: Skin is warm and dry.    ED Course  Procedures (including critical care time) Labs Review Labs Reviewed - No data to display  Imaging Review No results found.   MDM   1. Gastroenteritis, acute    Sx improved after ivf and meds at time of d/c    Linna Hoff, MD 01/29/14 [id number]

## 2014-01-29 NOTE — ED Notes (Signed)
See Physicians note; in before assessment Pt c/o abd pain onset last night Sx also include vomiting and diarrhea Denies fevers, urinary sx Alert w/no signs of acute distress.

## 2014-01-29 NOTE — Discharge Instructions (Signed)
Clear liquid , bland diet tonight as tolerated, advance on sun as improved, use medicine as needed, imodium for diarrhea, return or see your doctor if any problems.

## 2014-02-01 ENCOUNTER — Encounter: Payer: Self-pay | Admitting: Internal Medicine

## 2014-02-01 ENCOUNTER — Ambulatory Visit: Payer: Medicaid Other | Attending: Internal Medicine | Admitting: Internal Medicine

## 2014-02-01 VITALS — BP 134/85 | HR 91 | Temp 98.8°F | Resp 16 | Wt 165.0 lb

## 2014-02-01 DIAGNOSIS — I10 Essential (primary) hypertension: Secondary | ICD-10-CM | POA: Diagnosis not present

## 2014-02-01 DIAGNOSIS — J45909 Unspecified asthma, uncomplicated: Secondary | ICD-10-CM | POA: Diagnosis not present

## 2014-02-01 DIAGNOSIS — Z9861 Coronary angioplasty status: Secondary | ICD-10-CM | POA: Insufficient documentation

## 2014-02-01 DIAGNOSIS — I2510 Atherosclerotic heart disease of native coronary artery without angina pectoris: Secondary | ICD-10-CM | POA: Diagnosis not present

## 2014-02-01 DIAGNOSIS — E785 Hyperlipidemia, unspecified: Secondary | ICD-10-CM | POA: Insufficient documentation

## 2014-02-01 DIAGNOSIS — I252 Old myocardial infarction: Secondary | ICD-10-CM | POA: Diagnosis not present

## 2014-02-01 DIAGNOSIS — R51 Headache: Secondary | ICD-10-CM | POA: Insufficient documentation

## 2014-02-01 DIAGNOSIS — Z91199 Patient's noncompliance with other medical treatment and regimen due to unspecified reason: Secondary | ICD-10-CM | POA: Insufficient documentation

## 2014-02-01 DIAGNOSIS — Z9119 Patient's noncompliance with other medical treatment and regimen: Secondary | ICD-10-CM | POA: Diagnosis not present

## 2014-02-01 DIAGNOSIS — F319 Bipolar disorder, unspecified: Secondary | ICD-10-CM | POA: Diagnosis not present

## 2014-02-01 DIAGNOSIS — Z760 Encounter for issue of repeat prescription: Secondary | ICD-10-CM | POA: Diagnosis present

## 2014-02-01 LAB — COMPLETE METABOLIC PANEL WITH GFR
ALT: 26 U/L (ref 0–53)
AST: 29 U/L (ref 0–37)
Albumin: 4.1 g/dL (ref 3.5–5.2)
Alkaline Phosphatase: 65 U/L (ref 39–117)
BILIRUBIN TOTAL: 0.3 mg/dL (ref 0.2–1.2)
BUN: 20 mg/dL (ref 6–23)
CO2: 25 meq/L (ref 19–32)
CREATININE: 0.88 mg/dL (ref 0.50–1.35)
Calcium: 9.4 mg/dL (ref 8.4–10.5)
Chloride: 105 mEq/L (ref 96–112)
GLUCOSE: 88 mg/dL (ref 70–99)
Potassium: 3.6 mEq/L (ref 3.5–5.3)
Sodium: 139 mEq/L (ref 135–145)
Total Protein: 6.4 g/dL (ref 6.0–8.3)

## 2014-02-01 LAB — LIPID PANEL
CHOL/HDL RATIO: 3.1 ratio
Cholesterol: 169 mg/dL (ref 0–200)
HDL: 55 mg/dL (ref >39)
LDL Cholesterol: 86 mg/dL (ref 0–99)
TRIGLYCERIDES: 139 mg/dL (ref <150)
VLDL: 28 mg/dL (ref 0–40)

## 2014-02-01 MED ORDER — BUTALBITAL-APAP-CAFFEINE 50-325-40 MG PO TABS: ORAL_TABLET | ORAL | Status: DC

## 2014-02-01 MED ORDER — BENAZEPRIL HCL 10 MG PO TABS: 10.0000 mg | ORAL_TABLET | Freq: Every day | ORAL | Status: AC

## 2014-02-01 MED ORDER — CARVEDILOL 6.25 MG PO TABS: 6.2500 mg | ORAL_TABLET | Freq: Two times a day (BID) | ORAL | Status: AC

## 2014-02-01 MED ORDER — PRAVASTATIN SODIUM 10 MG PO TABS: 10.0000 mg | ORAL_TABLET | Freq: Every day | ORAL | Status: AC

## 2014-02-01 NOTE — Progress Notes (Signed)
Patient here for follow up Complains of having frequent headaches

## 2014-02-01 NOTE — Progress Notes (Signed)
MRN: [id number] Name: Jesse Watkins  Sex: male Age: 48 y.o. DOB: 1965/11/30  Allergies: Tylenol  Chief Complaint  Patient presents with  . Follow-up    HPI: Patient is 48 y.o. male who has history of hypertension, CAD, hyperlipidemia comes today for followup her, patient is requesting refill on his medications has occasional headache and takes Fioricet when necessary, following up with psychiatrist history of mood disorder, and depression, patient denies any acute symptoms.  Past Medical History  Diagnosis Date  . Heart attack   . Hypertension   . Bipolar 1 disorder   . Asthma   . Bronchitis     Past Surgical History  Procedure Laterality Date  . Coronary stent placement        Medication List       This list is accurate as of: 02/01/14  4:02 PM.  Always use your most recent med list.               albuterol 108 (90 BASE) MCG/ACT inhaler  Commonly known as:  PROVENTIL HFA;VENTOLIN HFA  Inhale 2 puffs into the lungs every 6 (six) hours as needed for wheezing or shortness of breath.     aspirin 325 MG tablet  Take 1 tablet (325 mg total) by mouth daily.     benazepril 10 MG tablet  Commonly known as:  LOTENSIN  Take 1 tablet (10 mg total) by mouth daily.     benztropine 0.5 MG tablet  Commonly known as:  COGENTIN  Take 0.5 mg by mouth 2 (two) times daily.     butalbital-acetaminophen-caffeine 50-325-40 MG per tablet  Commonly known as:  FIORICET, ESGIC  TAKE ONE TABLET BY MOUTH EVERY SIX HOURS FOR HEADCHE     carvedilol 6.25 MG tablet  Commonly known as:  COREG  Take 1 tablet (6.25 mg total) by mouth 2 (two) times daily with a meal.     clonazePAM 0.5 MG tablet  Commonly known as:  KLONOPIN  Take 0.5 mg by mouth 2 (two) times daily as needed for anxiety.     divalproex 500 MG DR tablet  Commonly known as:  DEPAKOTE  Take 500 mg by mouth 3 (three) times daily.     FLUoxetine 10 MG capsule  Commonly known as:  PROZAC  Take 10 mg by mouth daily.       haloperidol 5 MG tablet  Commonly known as:  HALDOL  Take 5 mg by mouth 2 (two) times daily.     methocarbamol 500 MG tablet  Commonly known as:  ROBAXIN  Take 1 tablet (500 mg total) by mouth 2 (two) times daily.     nitroGLYCERIN 0.4 MG SL tablet  Commonly known as:  NITROSTAT  Place 1 tablet (0.4 mg total) under the tongue every 5 (five) minutes as needed for chest pain.     ondansetron 4 MG tablet  Commonly known as:  ZOFRAN  Take 1 tablet (4 mg total) by mouth every 6 (six) hours. Prn n/v     pravastatin 10 MG tablet  Commonly known as:  PRAVACHOL  Take 1 tablet (10 mg total) by mouth daily.     traZODone 100 MG tablet  Commonly known as:  DESYREL  Take 100 mg by mouth at bedtime.        Meds ordered this encounter  Medications  . pravastatin (PRAVACHOL) 10 MG tablet    Sig: Take 1 tablet (10 mg total) by mouth daily.    Dispense:  30 tablet    Refill:  3  . carvedilol (COREG) 6.25 MG tablet    Sig: Take 1 tablet (6.25 mg total) by mouth 2 (two) times daily with a meal.    Dispense:  60 tablet    Refill:  3  . benazepril (LOTENSIN) 10 MG tablet    Sig: Take 1 tablet (10 mg total) by mouth daily.    Dispense:  30 tablet    Refill:  3  . butalbital-acetaminophen-caffeine (FIORICET, ESGIC) 50-325-40 MG per tablet    Sig: TAKE ONE TABLET BY MOUTH EVERY SIX HOURS FOR HEADCHE    Dispense:  30 tablet    Refill:  0     There is no immunization history on file for this patient.  Family History  Problem Relation Age of Onset  . Heart disease Mother   . Heart disease Father   . Stroke Brother   . Cancer Maternal Grandmother     bone cancer     History  Substance Use Topics  . Smoking status: Former Games developermoker  . Smokeless tobacco: Not on file  . Alcohol Use: No    Review of Systems   As noted in HPI  Filed Vitals:   02/01/14 1515  BP: 134/85  Pulse: 91  Temp: 98.8 F (37.1 C)  Resp: 16    Physical Exam  Physical Exam  Constitutional: He  is oriented to person, place, and time. No distress.  Eyes: EOM are normal. Pupils are equal, round, and reactive to light.  Cardiovascular: Normal rate.   Pulmonary/Chest: Breath sounds normal. No respiratory distress. He has no wheezes. He has no rales.  Neurological: He is oriented to person, place, and time. He has normal reflexes. No cranial nerve deficit.    CBC    Component Value Date/Time   WBC 4.0 10/15/2013 0608   RBC 4.14* 10/15/2013 0608   HGB 13.2 10/15/2013 0608   HCT 39.3 10/15/2013 0608   PLT 198 10/15/2013 0608   MCV 94.9 10/15/2013 0608   LYMPHSABS 2.1 10/15/2013 0608   MONOABS 0.3 10/15/2013 0608   EOSABS 0.1 10/15/2013 0608   BASOSABS 0.0 10/15/2013 0608    CMP     Component Value Date/Time   NA 141 10/15/2013 0608   K 4.1 10/15/2013 0608   CL 103 10/15/2013 0608   CO2 26 10/15/2013 0608   GLUCOSE 100* 10/15/2013 0608   BUN 21 10/15/2013 0608   CREATININE 0.86 10/15/2013 0608   CALCIUM 8.8 10/15/2013 0608   PROT 7.0 10/15/2013 0608   ALBUMIN 3.7 10/15/2013 0608   AST 28 10/15/2013 0608   ALT 27 10/15/2013 0608   ALKPHOS 68 10/15/2013 0608   BILITOT 0.4 10/15/2013 0608   GFRNONAA >90 10/15/2013 0608   GFRAA >90 10/15/2013 0608    No results found for this basename: chol, tri, ldl    No components found with this basename: hga1c    Lab Results  Component Value Date/Time   AST 28 10/15/2013  6:08 AM    Assessment and Plan  Essential hypertension, benign - Plan: Continue with her benazepril (LOTENSIN) 10 MG tablet, will repeat his blood chemistry COMPLETE METABOLIC PANEL WITH GFR  Other and unspecified hyperlipidemia - Plan: Continue with her pravastatin (PRAVACHOL) 10 MG tablet, repeat her Lipid panel  Headache(784.0) Fioricet when necessary  CAD (coronary artery disease) - Plan: pravastatin (PRAVACHOL) 10 MG tablet, carvedilol (COREG) 6.25 MG tablet    Return in about 3 months (around 05/04/2014) for hypertension.  Doris Cheadle, MD

## 2014-03-03 ENCOUNTER — Other Ambulatory Visit: Payer: Self-pay | Admitting: Internal Medicine

## 2014-03-04 ENCOUNTER — Other Ambulatory Visit: Payer: Self-pay

## 2014-03-04 MED ORDER — BUTALBITAL-APAP-CAFFEINE 50-325-40 MG PO TABS: ORAL_TABLET | ORAL | Status: DC

## 2014-03-09 ENCOUNTER — Other Ambulatory Visit: Payer: Self-pay | Admitting: Internal Medicine

## 2014-03-28 ENCOUNTER — Other Ambulatory Visit: Payer: Self-pay | Admitting: Internal Medicine

## 2014-03-28 ENCOUNTER — Ambulatory Visit: Payer: Medicaid Other | Attending: Internal Medicine | Admitting: Internal Medicine

## 2014-03-28 ENCOUNTER — Encounter: Payer: Self-pay | Admitting: Internal Medicine

## 2014-03-28 VITALS — BP 115/77 | HR 73 | Temp 97.6°F | Resp 16 | Wt 163.2 lb

## 2014-03-28 DIAGNOSIS — J45909 Unspecified asthma, uncomplicated: Secondary | ICD-10-CM | POA: Diagnosis not present

## 2014-03-28 DIAGNOSIS — Z87891 Personal history of nicotine dependence: Secondary | ICD-10-CM | POA: Insufficient documentation

## 2014-03-28 DIAGNOSIS — F319 Bipolar disorder, unspecified: Secondary | ICD-10-CM | POA: Insufficient documentation

## 2014-03-28 DIAGNOSIS — R197 Diarrhea, unspecified: Secondary | ICD-10-CM | POA: Insufficient documentation

## 2014-03-28 DIAGNOSIS — I10 Essential (primary) hypertension: Secondary | ICD-10-CM | POA: Diagnosis not present

## 2014-03-28 DIAGNOSIS — R195 Other fecal abnormalities: Secondary | ICD-10-CM

## 2014-03-28 DIAGNOSIS — Z7982 Long term (current) use of aspirin: Secondary | ICD-10-CM | POA: Insufficient documentation

## 2014-03-28 DIAGNOSIS — R51 Headache: Secondary | ICD-10-CM | POA: Insufficient documentation

## 2014-03-28 MED ORDER — BUTALBITAL-APAP-CAFFEINE 50-325-40 MG PO TABS: ORAL_TABLET | ORAL | Status: DC

## 2014-03-28 NOTE — Patient Instructions (Signed)

## 2014-03-28 NOTE — Progress Notes (Signed)
Patient complains of still having headaches States has had diarrhea for the past two days

## 2014-03-28 NOTE — Progress Notes (Signed)
MRN: [id number] Name: Jesse Watkins  Sex: male Age: 48 y.o. DOB: 1966-08-16  Allergies: Tylenol  Chief Complaint  Patient presents with  . Diarrhea    HPI: Patient is 48 y.o. male who comes today reported to have loose bowel movements for the last 2-3 days, denies any fever chills nausea vomiting, as per patient he had 5 bowel movements yesterday which were watery, patient has not noticed any blood in his stools, patient also is history of chronic headaches and is requesting refill on his medication denies any change, denies any numbness weakness. Patient has been able to tolerate by mouth and is drinking Gatorade.    Past Medical History  Diagnosis Date  . Heart attack   . Hypertension   . Bipolar 1 disorder   . Asthma   . Bronchitis     Past Surgical History  Procedure Laterality Date  . Coronary stent placement        Medication List       This list is accurate as of: 03/28/14  2:24 PM.  Always use your most recent med list.               albuterol 108 (90 BASE) MCG/ACT inhaler  Commonly known as:  PROVENTIL HFA;VENTOLIN HFA  Inhale 2 puffs into the lungs every 6 (six) hours as needed for wheezing or shortness of breath.     aspirin 325 MG tablet  Take 1 tablet (325 mg total) by mouth daily.     benazepril 10 MG tablet  Commonly known as:  LOTENSIN  Take 1 tablet (10 mg total) by mouth daily.     benztropine 0.5 MG tablet  Commonly known as:  COGENTIN  Take 0.5 mg by mouth 2 (two) times daily.     butalbital-acetaminophen-caffeine 50-325-40 MG per tablet  Commonly known as:  FIORICET, ESGIC  TAKE 1 TABLET BY MOUTH EVERY 6 HOURS FOR HEADACHE     carvedilol 6.25 MG tablet  Commonly known as:  COREG  Take 1 tablet (6.25 mg total) by mouth 2 (two) times daily with a meal.     clonazePAM 0.5 MG tablet  Commonly known as:  KLONOPIN  Take 0.5 mg by mouth 2 (two) times daily as needed for anxiety.     divalproex 500 MG DR tablet  Commonly known as:   DEPAKOTE  Take 500 mg by mouth 3 (three) times daily.     FLUoxetine 10 MG capsule  Commonly known as:  PROZAC  Take 10 mg by mouth daily.     haloperidol 5 MG tablet  Commonly known as:  HALDOL  Take 5 mg by mouth 2 (two) times daily.     methocarbamol 500 MG tablet  Commonly known as:  ROBAXIN  Take 1 tablet (500 mg total) by mouth 2 (two) times daily.     nitroGLYCERIN 0.4 MG SL tablet  Commonly known as:  NITROSTAT  Place 1 tablet (0.4 mg total) under the tongue every 5 (five) minutes as needed for chest pain.     ondansetron 4 MG tablet  Commonly known as:  ZOFRAN  Take 1 tablet (4 mg total) by mouth every 6 (six) hours. Prn n/v     pravastatin 10 MG tablet  Commonly known as:  PRAVACHOL  Take 1 tablet (10 mg total) by mouth daily.     traZODone 100 MG tablet  Commonly known as:  DESYREL  Take 100 mg by mouth at bedtime.  Meds ordered this encounter  Medications  . butalbital-acetaminophen-caffeine (FIORICET, ESGIC) 50-325-40 MG per tablet    Sig: TAKE 1 TABLET BY MOUTH EVERY 6 HOURS FOR HEADACHE    Dispense:  30 tablet    Refill:  0     There is no immunization history on file for this patient.  Family History  Problem Relation Age of Onset  . Heart disease Mother   . Heart disease Father   . Stroke Brother   . Cancer Maternal Grandmother     bone cancer     History  Substance Use Topics  . Smoking status: Former Games developermoker  . Smokeless tobacco: Not on file  . Alcohol Use: No    Review of Systems   As noted in HPI  Filed Vitals:   03/28/14 1411  BP: 115/77  Pulse: 73  Temp: 97.6 F (36.4 C)  Resp: 16    Physical Exam  Physical Exam  Constitutional: No distress.  Cardiovascular: Normal rate and regular rhythm.   Pulmonary/Chest: Breath sounds normal. No respiratory distress. He has no wheezes. He has no rales.  Abdominal: Soft. There is no tenderness. There is no rebound and no guarding.  Musculoskeletal: He exhibits no edema.      CBC    Component Value Date/Time   WBC 4.0 10/15/2013 0608   RBC 4.14* 10/15/2013 0608   HGB 13.2 10/15/2013 0608   HCT 39.3 10/15/2013 0608   PLT 198 10/15/2013 0608   MCV 94.9 10/15/2013 0608   LYMPHSABS 2.1 10/15/2013 0608   MONOABS 0.3 10/15/2013 0608   EOSABS 0.1 10/15/2013 0608   BASOSABS 0.0 10/15/2013 0608    CMP     Component Value Date/Time   NA 139 02/01/2014 1604   K 3.6 02/01/2014 1604   CL 105 02/01/2014 1604   CO2 25 02/01/2014 1604   GLUCOSE 88 02/01/2014 1604   BUN 20 02/01/2014 1604   CREATININE 0.88 02/01/2014 1604   CREATININE 0.86 10/15/2013 0608   CALCIUM 9.4 02/01/2014 1604   PROT 6.4 02/01/2014 1604   ALBUMIN 4.1 02/01/2014 1604   AST 29 02/01/2014 1604   ALT 26 02/01/2014 1604   ALKPHOS 65 02/01/2014 1604   BILITOT 0.3 02/01/2014 1604   GFRNONAA >89 02/01/2014 1604   GFRNONAA >90 10/15/2013 0608   GFRAA >89 02/01/2014 1604   GFRAA >90 10/15/2013 0608    Lab Results  Component Value Date/Time   CHOL 169 02/01/2014  4:04 PM    No components found with this basename: hga1c    Lab Results  Component Value Date/Time   AST 29 02/01/2014  4:04 PM    Assessment and Plan  Loose bowel movement - Plan: I have ordered a stool workup Stool culture, Clostridium difficile EIA,Ova and parasite examination, the check blood chemistry COMPLETE METABOLIC PANEL WITH GFR, I have advised patient for BRAT  diet  Headache(784.0) - Plan: butalbital-acetaminophen-caffeine (FIORICET, ESGIC) 50-325-40 MG per tablet   Return if symptoms worsen or fail to improve.  Doris CheadleADVANI, Deunta Beneke, MD

## 2014-03-29 LAB — COMPLETE METABOLIC PANEL WITH GFR
ALK PHOS: 78 U/L (ref 39–117)
ALT: 21 U/L (ref 0–53)
AST: 20 U/L (ref 0–37)
Albumin: 4.2 g/dL (ref 3.5–5.2)
BILIRUBIN TOTAL: 0.3 mg/dL (ref 0.2–1.2)
BUN: 22 mg/dL (ref 6–23)
CO2: 19 mEq/L (ref 19–32)
CREATININE: 0.91 mg/dL (ref 0.50–1.35)
Calcium: 8.8 mg/dL (ref 8.4–10.5)
Chloride: 107 mEq/L (ref 96–112)
GFR, Est Non African American: >89 mL/min
Glucose, Bld: 83 mg/dL (ref 70–99)
Potassium: 4.3 mEq/L (ref 3.5–5.3)
SODIUM: 136 meq/L (ref 135–145)
TOTAL PROTEIN: 7.2 g/dL (ref 6.0–8.3)

## 2014-03-29 LAB — C. DIFFICILE GDH AND TOXIN A/B
C. DIFF TOXIN A/B: NOT DETECTED
C. difficile GDH: NOT DETECTED

## 2014-03-30 LAB — OVA AND PARASITE EXAMINATION: OP: NONE SEEN

## 2014-04-01 LAB — STOOL CULTURE

## 2014-04-25 ENCOUNTER — Encounter (HOSPITAL_COMMUNITY): Payer: Self-pay | Admitting: Emergency Medicine

## 2014-04-25 ENCOUNTER — Emergency Department (HOSPITAL_COMMUNITY): Payer: Medicaid Other

## 2014-04-25 ENCOUNTER — Observation Stay (HOSPITAL_COMMUNITY)
Admission: EM | Admit: 2014-04-25 | Discharge: 2014-04-26 | Disposition: A | Discharge status: Home / Self Care | Payer: Medicaid Other | Attending: Internal Medicine | Admitting: Internal Medicine

## 2014-04-25 DIAGNOSIS — Z9861 Coronary angioplasty status: Secondary | ICD-10-CM | POA: Diagnosis not present

## 2014-04-25 DIAGNOSIS — Z79899 Other long term (current) drug therapy: Secondary | ICD-10-CM | POA: Insufficient documentation

## 2014-04-25 DIAGNOSIS — Z7982 Long term (current) use of aspirin: Secondary | ICD-10-CM | POA: Insufficient documentation

## 2014-04-25 DIAGNOSIS — J45909 Unspecified asthma, uncomplicated: Secondary | ICD-10-CM | POA: Diagnosis not present

## 2014-04-25 DIAGNOSIS — F319 Bipolar disorder, unspecified: Secondary | ICD-10-CM | POA: Diagnosis not present

## 2014-04-25 DIAGNOSIS — Z87891 Personal history of nicotine dependence: Secondary | ICD-10-CM | POA: Insufficient documentation

## 2014-04-25 DIAGNOSIS — R079 Chest pain, unspecified: Secondary | ICD-10-CM | POA: Diagnosis present

## 2014-04-25 DIAGNOSIS — R0789 Other chest pain: Principal | ICD-10-CM | POA: Insufficient documentation

## 2014-04-25 DIAGNOSIS — I10 Essential (primary) hypertension: Secondary | ICD-10-CM | POA: Insufficient documentation

## 2014-04-25 DIAGNOSIS — I2510 Atherosclerotic heart disease of native coronary artery without angina pectoris: Secondary | ICD-10-CM | POA: Diagnosis present

## 2014-04-25 DIAGNOSIS — I252 Old myocardial infarction: Secondary | ICD-10-CM | POA: Insufficient documentation

## 2014-04-25 DIAGNOSIS — E785 Hyperlipidemia, unspecified: Secondary | ICD-10-CM

## 2014-04-25 HISTORY — DX: Shortness of breath: R06.02

## 2014-04-25 HISTORY — DX: Headache: R51

## 2014-04-25 HISTORY — DX: Anxiety disorder, unspecified: F41.9

## 2014-04-25 LAB — CBC
HCT: 37.6 % — ABNORMAL LOW (ref 39.0–52.0)
HEMATOCRIT: 40 % (ref 39.0–52.0)
Hemoglobin: 13.5 g/dL (ref 13.0–17.0)
Hemoglobin: 12.6 g/dL — ABNORMAL LOW (ref 13.0–17.0)
MCH: 31.8 pg (ref 26.0–34.0)
MCH: 31.2 pg (ref 26.0–34.0)
MCHC: 33.8 g/dL (ref 30.0–36.0)
MCHC: 33.5 g/dL (ref 30.0–36.0)
MCV: 94.3 fL (ref 78.0–100.0)
MCV: 93.1 fL (ref 78.0–100.0)
Platelets: 189 10*3/uL (ref 150–400)
Platelets: 186 10*3/uL (ref 150–400)
RBC: 4.24 MIL/uL (ref 4.22–5.81)
RBC: 4.04 MIL/uL — AB (ref 4.22–5.81)
RDW: 12.4 % (ref 11.5–15.5)
RDW: 12.1 % (ref 11.5–15.5)
WBC: 4.2 10*3/uL (ref 4.0–10.5)
WBC: 5.9 10*3/uL (ref 4.0–10.5)

## 2014-04-25 LAB — RAPID URINE DRUG SCREEN, HOSP PERFORMED
Amphetamines: NOT DETECTED
BENZODIAZEPINES: NOT DETECTED
Barbiturates: POSITIVE — AB
Cocaine: NOT DETECTED
OPIATES: POSITIVE — AB
Tetrahydrocannabinol: NOT DETECTED

## 2014-04-25 LAB — I-STAT TROPONIN, ED: TROPONIN I, POC: 0.01 ng/mL (ref 0.00–0.08)

## 2014-04-25 LAB — URINALYSIS W MICROSCOPIC (NOT AT ARMC)
Bilirubin Urine: NEGATIVE
GLUCOSE, UA: NEGATIVE mg/dL
Hgb urine dipstick: NEGATIVE
KETONES UR: NEGATIVE mg/dL
Leukocytes, UA: NEGATIVE
Nitrite: NEGATIVE
Protein, ur: NEGATIVE mg/dL
SPECIFIC GRAVITY, URINE: 1.027 (ref 1.005–1.030)
Urobilinogen, UA: 0.2 mg/dL (ref 0.0–1.0)
pH: 7.5 (ref 5.0–8.0)

## 2014-04-25 LAB — TROPONIN I
Troponin I: <0.3 ng/mL (ref <0.30)
Troponin I: <0.3 ng/mL (ref <0.30)

## 2014-04-25 LAB — PRO B NATRIURETIC PEPTIDE: PRO B NATRI PEPTIDE: 66.5 pg/mL (ref 0–125)

## 2014-04-25 LAB — BASIC METABOLIC PANEL
Anion gap: 11 (ref 5–15)
BUN: 21 mg/dL (ref 6–23)
CALCIUM: 9.1 mg/dL (ref 8.4–10.5)
CO2: 27 meq/L (ref 19–32)
Chloride: 102 mEq/L (ref 96–112)
Creatinine, Ser: 0.79 mg/dL (ref 0.50–1.35)
GFR calc Af Amer: >90 mL/min (ref >90)
Glucose, Bld: 109 mg/dL — ABNORMAL HIGH (ref 70–99)
POTASSIUM: 4.3 meq/L (ref 3.7–5.3)
SODIUM: 140 meq/L (ref 137–147)

## 2014-04-25 LAB — CREATININE, SERUM
CREATININE: 0.72 mg/dL (ref 0.50–1.35)
GFR calc non Af Amer: >90 mL/min (ref >90)

## 2014-04-25 LAB — VALPROIC ACID LEVEL: Valproic Acid Lvl: 36.3 ug/mL — ABNORMAL LOW (ref 50.0–100.0)

## 2014-04-25 MED ORDER — ONDANSETRON HCL 4 MG/2ML IJ SOLN
4.0000 mg | Freq: Once | INTRAMUSCULAR | Status: AC
  Administered 2014-04-25: 4 mg via INTRAVENOUS
  Filled 2014-04-25: qty 2

## 2014-04-25 MED ORDER — NITROGLYCERIN 2 % TD OINT
1.0000 [in_us] | TOPICAL_OINTMENT | Freq: Once | TRANSDERMAL | Status: AC
  Administered 2014-04-25: 1 [in_us] via TOPICAL
  Filled 2014-04-25: qty 1

## 2014-04-25 MED ORDER — TROLAMINE SALICYLATE 10 % EX CREA
TOPICAL_CREAM | Freq: Two times a day (BID) | CUTANEOUS | Status: DC | PRN
  Filled 2014-04-25: qty 85

## 2014-04-25 MED ORDER — MORPHINE SULFATE 4 MG/ML IJ SOLN
4.0000 mg | Freq: Once | INTRAMUSCULAR | Status: AC
  Administered 2014-04-25: 4 mg via INTRAVENOUS
  Filled 2014-04-25: qty 1

## 2014-04-25 MED ORDER — CLONAZEPAM 0.5 MG PO TABS: 0.5000 mg | ORAL_TABLET | Freq: Two times a day (BID) | ORAL | Status: DC | PRN

## 2014-04-25 MED ORDER — HALOPERIDOL 5 MG PO TABS
5.0000 mg | ORAL_TABLET | Freq: Two times a day (BID) | ORAL | Status: DC
  Administered 2014-04-25 – 2014-04-26 (×3): 5 mg via ORAL
  Filled 2014-04-25 (×4): qty 1

## 2014-04-25 MED ORDER — TROLAMINE SALICYLATE 10 % EX CREA: TOPICAL_CREAM | Freq: Two times a day (BID) | CUTANEOUS | Status: DC | PRN

## 2014-04-25 MED ORDER — NITROGLYCERIN 0.4 MG SL SUBL: 0.4000 mg | SUBLINGUAL_TABLET | SUBLINGUAL | Status: DC | PRN

## 2014-04-25 MED ORDER — ALBUTEROL SULFATE (2.5 MG/3ML) 0.083% IN NEBU: 2.5000 mg | INHALATION_SOLUTION | Freq: Four times a day (QID) | RESPIRATORY_TRACT | Status: DC | PRN

## 2014-04-25 MED ORDER — ASPIRIN 325 MG PO TABS
325.0000 mg | ORAL_TABLET | Freq: Every day | ORAL | Status: DC
  Administered 2014-04-26: 325 mg via ORAL
  Filled 2014-04-25: qty 1

## 2014-04-25 MED ORDER — GI COCKTAIL ~~LOC~~: 30.0000 mL | Freq: Four times a day (QID) | ORAL | Status: DC | PRN

## 2014-04-25 MED ORDER — BENZTROPINE MESYLATE 0.5 MG PO TABS
0.5000 mg | ORAL_TABLET | Freq: Two times a day (BID) | ORAL | Status: DC
  Administered 2014-04-25 – 2014-04-26 (×3): 0.5 mg via ORAL
  Filled 2014-04-25 (×4): qty 1

## 2014-04-25 MED ORDER — MORPHINE SULFATE 2 MG/ML IJ SOLN
2.0000 mg | INTRAMUSCULAR | Status: DC | PRN
Start: 2014-04-25 — End: 2014-04-26

## 2014-04-25 MED ORDER — MUSCLE RUB 10-15 % EX CREA
TOPICAL_CREAM | CUTANEOUS | Status: DC | PRN
  Filled 2014-04-25: qty 85

## 2014-04-25 MED ORDER — ASPIRIN 325 MG PO TABS
325.0000 mg | ORAL_TABLET | ORAL | Status: AC
  Administered 2014-04-25: 325 mg via ORAL
  Filled 2014-04-25: qty 1

## 2014-04-25 MED ORDER — NITROGLYCERIN 0.4 MG SL SUBL
0.4000 mg | SUBLINGUAL_TABLET | SUBLINGUAL | Status: DC | PRN
  Administered 2014-04-25: 0.4 mg via SUBLINGUAL
  Filled 2014-04-25: qty 1

## 2014-04-25 MED ORDER — ONDANSETRON HCL 4 MG/2ML IJ SOLN: 4.0000 mg | Freq: Four times a day (QID) | INTRAMUSCULAR | Status: DC | PRN

## 2014-04-25 MED ORDER — BENAZEPRIL HCL 10 MG PO TABS
10.0000 mg | ORAL_TABLET | Freq: Every day | ORAL | Status: DC
  Administered 2014-04-25 – 2014-04-26 (×2): 10 mg via ORAL
  Filled 2014-04-25 (×3): qty 1

## 2014-04-25 MED ORDER — TRAZODONE HCL 100 MG PO TABS
100.0000 mg | ORAL_TABLET | Freq: Every day | ORAL | Status: DC
  Administered 2014-04-25: 100 mg via ORAL
  Filled 2014-04-25 (×3): qty 1

## 2014-04-25 MED ORDER — ACETAMINOPHEN 325 MG PO TABS
650.0000 mg | ORAL_TABLET | ORAL | Status: DC | PRN
  Administered 2014-04-25 (×2): 650 mg via ORAL
  Filled 2014-04-25 (×3): qty 2

## 2014-04-25 MED ORDER — ALBUTEROL SULFATE HFA 108 (90 BASE) MCG/ACT IN AERS: 2.0000 | INHALATION_SPRAY | Freq: Four times a day (QID) | RESPIRATORY_TRACT | Status: DC | PRN

## 2014-04-25 MED ORDER — CARVEDILOL 6.25 MG PO TABS
6.2500 mg | ORAL_TABLET | Freq: Two times a day (BID) | ORAL | Status: DC
  Administered 2014-04-25 – 2014-04-26 (×3): 6.25 mg via ORAL
  Filled 2014-04-25 (×5): qty 1

## 2014-04-25 MED ORDER — FLUOXETINE HCL 10 MG PO CAPS
10.0000 mg | ORAL_CAPSULE | Freq: Every day | ORAL | Status: DC
  Administered 2014-04-25 – 2014-04-26 (×2): 10 mg via ORAL
  Filled 2014-04-25 (×3): qty 1

## 2014-04-25 MED ORDER — ENOXAPARIN SODIUM 40 MG/0.4ML ~~LOC~~ SOLN
40.0000 mg | SUBCUTANEOUS | Status: DC
  Administered 2014-04-25: 40 mg via SUBCUTANEOUS
  Filled 2014-04-25 (×4): qty 0.4

## 2014-04-25 MED ORDER — DIVALPROEX SODIUM 500 MG PO DR TAB
500.0000 mg | DELAYED_RELEASE_TABLET | Freq: Three times a day (TID) | ORAL | Status: DC
  Administered 2014-04-25 – 2014-04-26 (×4): 500 mg via ORAL
  Filled 2014-04-25 (×6): qty 1

## 2014-04-25 MED ORDER — SIMVASTATIN 5 MG PO TABS
5.0000 mg | ORAL_TABLET | Freq: Every day | ORAL | Status: DC
  Administered 2014-04-25: 5 mg via ORAL
  Filled 2014-04-25 (×3): qty 1

## 2014-04-25 NOTE — ED Notes (Addendum)
TooK patient PRN tylenol. Pt refused. Requesting fioricet for pain but not currently ordered.

## 2014-04-25 NOTE — ED Notes (Signed)
The pt is c/o mid-chest pain since last night.  Sob  Nausea vomiting and diarrhea with abd pain.  No sob at present

## 2014-04-25 NOTE — ED Provider Notes (Signed)
CSN: [id number]     Arrival date & time 04/25/14  0430 History   First MD Initiated Contact with Patient 04/25/14 [phone]     Chief Complaint  Patient presents with  . Chest Pain     (Consider location/radiation/quality/duration/timing/severity/associated sxs/prior Treatment) HPI  Patient is a 48 yo man who is s/p MI treated with PCI in 2009. The patient comes in today with about 14 hrs of centrally located chest tightness. His sx began while he was working yesterday. 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   He took a NTG tablet and this helped to relieve his pain but, not altogether. Pain is currently 7/10, non-radiating. Patient denies SOB. He had an ASA PTA. He says his sx feel like previous MI.   However, he also says that he has chest pain every day and that this pain is similar. Patient also adds that each of his parents died secondary to MI and that he is scared. He is not followed by a cardiologist and can't recall when hist last functional study was.   Past Medical History  Diagnosis Date  . Heart attack   . Hypertension   . Bipolar 1 disorder   . Asthma   . Bronchitis    Past Surgical History  Procedure Laterality Date  . Coronary stent placement     Family History  Problem Relation Age of Onset  . Heart disease Mother   . Heart disease Father   . Stroke Brother   . Cancer Maternal Grandmother     bone cancer    History  Substance Use Topics  . Smoking status: Former Games developermoker  . Smokeless tobacco: Not on file  . Alcohol Use: No    Review of Systems     10 point review of symptoms obtained and is negative with the exceptions of symptoms noted abov.e    Allergies  Tylenol  Home Medications   Prior to Admission medications   Medication Sig Start Date End Date Taking? Authorizing Provider  aspirin 325 MG tablet Take 1 tablet (325 mg total) by mouth daily. 01/12/14  Yes Gaylord Shihhomas C Wall, MD  benazepril (LOTENSIN) 10 MG  tablet Take 1 tablet (10 mg total) by mouth daily. 02/01/14  Yes Doris Cheadleeepak Advani, MD  benztropine (COGENTIN) 0.5 MG tablet Take 0.5 mg by mouth 2 (two) times daily.   Yes Historical Provider, MD  carvedilol (COREG) 6.25 MG tablet Take 1 tablet (6.25 mg total) by mouth 2 (two) times daily with a meal. 02/01/14  Yes Deepak Advani, MD  divalproex (DEPAKOTE) 500 MG DR tablet Take 500 mg by mouth 3 (three) times daily.   Yes Historical Provider, MD  FLUoxetine (PROZAC) 10 MG capsule Take 10 mg by mouth daily.   Yes Historical Provider, MD  haloperidol (HALDOL) 5 MG tablet Take 5 mg by mouth 2 (two) times daily.   Yes Historical Provider, MD  nitroGLYCERIN (NITROSTAT) 0.4 MG SL tablet Place 1 tablet (0.4 mg total) under the tongue every 5 (five) minutes as needed for chest pain. 01/12/14  Yes Gaylord Shihhomas C Wall, MD  pravastatin (PRAVACHOL) 10 MG tablet Take 1 tablet (10 mg total) by mouth daily. 02/01/14  Yes Doris Cheadleeepak Advani, MD  traZODone (DESYREL) 100 MG tablet Take 100 mg by mouth at bedtime.   Yes Historical Provider, MD  albuterol (PROVENTIL HFA;VENTOLIN HFA) 108 (90 BASE) MCG/ACT inhaler Inhale 2 puffs into the lungs every 6 (six) hours as  needed for wheezing or shortness of breath. 11/17/13   Doris Cheadle, MD  butalbital-acetaminophen-caffeine (FIORICET, ESGIC) 50-325-40 MG per tablet TAKE 1 TABLET BY MOUTH EVERY 6 HOURS FOR HEADACHE 03/28/14   Doris Cheadle, MD  clonazePAM (KLONOPIN) 0.5 MG tablet Take 0.5 mg by mouth 2 (two) times daily as needed for anxiety.    Historical Provider, MD  methocarbamol (ROBAXIN) 500 MG tablet Take 1 tablet (500 mg total) by mouth 2 (two) times daily. 08/29/13   Loren Racer, MD   BP 141/103 Physical Exam  Gen: well developed and well nourished appearing Head: NCAT Eyes: PERL, EOMI Nose: no epistaixis or rhinorrhea Mouth/throat: mucosa is moist and pink Neck: supple, no stridor Lungs: CTA B, no wheezing, rhonchi or rales CV: RRR, no murmur, extremities well perfused.   Abd: soft, notender, nondistended Back: no ttp, no cva ttp Skin: no rashese, wnl Ext: normal to inspection, no peripheral edema.  Neuro: CN ii-xii grossly intact, no focal deficits Psyche; normal affect,  calm and cooperative.   ED Course  Procedures (including critical care time) Labs Review  Results for orders placed during the hospital encounter of 04/25/14 (from the past 24 hour(s))  CBC     Status: None   Collection Time    04/25/14  4:40 AM      Result Value Ref Range   WBC 4.2  4.0 - 10.5 K/uL   RBC 4.24  4.22 - 5.81 MIL/uL   Hemoglobin 13.5  13.0 - 17.0 g/dL   HCT 16.1  09.6 - 04.5 %   MCV 94.3  78.0 - 100.0 fL   MCH 31.8  26.0 - 34.0 pg   MCHC 33.8  30.0 - 36.0 g/dL   RDW 40.9  81.1 - 91.4 %   Platelets 189  150 - 400 K/uL  BASIC METABOLIC PANEL     Status: Abnormal   Collection Time    04/25/14  4:40 AM      Result Value Ref Range   Sodium 140  137 - 147 mEq/L   Potassium 4.3  3.7 - 5.3 mEq/L   Chloride 102  96 - 112 mEq/L   CO2 27  19 - 32 mEq/L   Glucose, Bld 109 (*) 70 - 99 mg/dL   BUN 21  6 - 23 mg/dL   Creatinine, Ser 7.82  0.50 - 1.35 mg/dL   Calcium 9.1  8.4 - 95.6 mg/dL   GFR calc non Af Amer >90  >90 mL/min   GFR calc Af Amer >90  >90 mL/min   Anion gap 11  5 - 15  PRO B NATRIURETIC PEPTIDE     Status: None   Collection Time    04/25/14  4:55 AM      Result Value Ref Range   Pro B Natriuretic peptide (BNP) 66.5  0 - 125 pg/mL  I-STAT TROPOININ, ED     Status: None   Collection Time    04/25/14  5:01 AM      Result Value Ref Range   Troponin i, poc 0.01  0.00 - 0.08 ng/mL   Comment 3             Imaging Review Dg Chest 2 View  04/25/2014   CLINICAL DATA:  Chest pain and shortness of breath.  EXAM: CHEST  2 VIEW  COMPARISON:  10/15/2013  FINDINGS: Normal heart size and mediastinal contours. No acute infiltrate or edema. No effusion or pneumothorax. No acute osseous findings. Bone island noted in  the anterior right seventh rib.  IMPRESSION: No  active cardiopulmonary disease.   Electronically Signed   By: Tiburcio Pea M.D.   On: 04/25/2014 05:24   EKG: nsr, no acute ischemic changes, normal intervals, normal axis, normal qrs complex   MDM   DDX: ACS, pneumothorax, pneumonia, pericardial or pleural effusion, gastritis, GERD/PUD, musculoskeletal pain.   ED work up is non-diagnostic. Patient is pain free after tx with MS. We are treating with NTG & ASA.  I discussed the case with the night hospitalist, Dr. Allena Katz, at [phone].  He declined to accept the admission and asked me to page the day hospitalist.   [id number]: Case discussed with Dr. Roseanna Rainbow who has accepted the patient to Team 3.   Brandt Loosen, MD 04/25/14 [phone]

## 2014-04-25 NOTE — ED Notes (Signed)
Pt called out requesting tylenol for his HA.

## 2014-04-25 NOTE — H&P (Signed)
PATIENT DETAILS Name: Jesse Watkins Age: 48 y.o. Sex: male Date of Birth: December 11, 1965 Admit Date: 04/25/2014 [id number], Ayesha Rumpf, MD   CHIEF COMPLAINT:  Chest pain  HPI: Jesse Watkins is a 48 y.o. homeless male with a pmh of MI s/p stent placement 2009, bipolar disorder, depression, hyperlipidemia, and hypertension  who presents today with the above noted complaint.  He states the pain started yesterday morning while at work in the [employer].  The pain has remained constant throughout yesterday and today.  States this morning the pain was a 10/10, but now it's a 5/10.  It has somewhat improved after he took Aspirin and Nitroglycerin.  Pain is localized to the left part of his chest and reproducible with applied pressure.  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  He has had an episode of nausea, vomiting, and diarrhea in association with the pain.  Denies current SOB, hemoptysis, radiation of pain to jaw, neck, or arms.  Does not currently smoke but has a 2 pack year history and denies alcohol and illicit drug use.  He is positive for barbiturates and opiates on UDS.  POC troponin is 0.01 and BNP is 66.5.  He will be admitted for observation due to atypical chest pain.   ALLERGIES:   Allergies  Allergen Reactions  . Tylenol [Acetaminophen] Other (See Comments)    Increases heart rate     PAST MEDICAL HISTORY: Past Medical History  Diagnosis Date  . Heart attack   . Hypertension   . Bipolar 1 disorder   . Asthma   . Bronchitis     PAST SURGICAL HISTORY: Past Surgical History  Procedure Laterality Date  . Coronary stent placement      MEDICATIONS AT HOME: Prior to Admission medications   Medication Sig Start Date End Date Taking? Authorizing Provider  aspirin 325 MG tablet Take 1 tablet (325 mg total) by mouth daily. 01/12/14  Yes Gaylord Shih, MD  benazepril (LOTENSIN) 10 MG tablet Take 1 tablet (10 mg total) by mouth daily. 02/01/14  Yes Doris Cheadle, MD  benztropine (COGENTIN) 0.5 MG tablet Take 0.5 mg by mouth 2 (two) times daily.   Yes Historical Provider, MD  butalbital-acetaminophen-caffeine (FIORICET, ESGIC) 50-325-40 MG per tablet Take 1 tablet by mouth every 6 (six) hours as needed for headache.   Yes Historical Provider, MD  carvedilol (COREG) 6.25 MG tablet Take 1 tablet (6.25 mg total) by mouth 2 (two) times daily with a meal. 02/01/14  Yes Deepak Advani, MD  clonazePAM (KLONOPIN) 0.5 MG tablet Take 0.5 mg by mouth 2 (two) times daily as needed for anxiety.   Yes Historical Provider, MD  divalproex (DEPAKOTE) 500 MG DR tablet Take 500 mg by mouth 3 (three) times daily.   Yes Historical Provider, MD  FLUoxetine (PROZAC) 10 MG capsule Take 10 mg by mouth daily.   Yes Historical Provider, MD  haloperidol (HALDOL) 5 MG tablet Take 5 mg by mouth 2 (two) times daily.   Yes Historical Provider, MD  nitroGLYCERIN (NITROSTAT) 0.4 MG SL tablet Place 1 tablet (0.4 mg total) under the tongue every 5 (five) minutes as needed for chest pain. 01/12/14  Yes Gaylord Shih, MD  pravastatin (PRAVACHOL) 10 MG tablet Take 1 tablet (10 mg total) by mouth daily. 02/01/14  Yes Doris Cheadle, MD  traZODone (DESYREL) 100 MG tablet Take 100 mg by mouth at bedtime.   Yes Historical Provider, MD  albuterol (PROVENTIL HFA;VENTOLIN HFA) 108 (90 BASE) MCG/ACT inhaler Inhale 2 puffs into the lungs every 6 (six) hours as needed for wheezing or shortness of breath. 11/17/13   Doris Cheadle, MD    FAMILY HISTORY: Family History  Problem Relation Age of Onset  . Heart disease Mother   . Heart disease Father   . Stroke Brother   . Cancer Maternal Grandmother     bone cancer     SOCIAL HISTORY:  reports that he has quit smoking in 2009. He does not have any smokeless tobacco history on file. He reports that he does not drink alcohol or use illicit drugs.  REVIEW OF SYSTEMS:  Constitutional:   No  weight loss, night sweats,  Fevers, chills, fatigue.  HEENT:     No headaches, Difficulty swallowing,Tooth/dental problems,Sore throat,  No sneezing, itching, ear ache, nasal congestion, post nasal drip,   Cardio-vascular: No Orthopnea, PND, swelling in lower extremities, anasarca, dizziness, palpitations  GI:  No heartburn, indigestion, abdominal pain, loss of appetite  Resp: No shortness of breath with exertion or at rest.  No excess mucus, no productive cough, No non-productive cough,  No coughing up of blood.No change in color of mucus. No wheezing. No chest wall deformity  Skin:  no rash or lesions.  GU:  no dysuria, change in color of urine, no urgency or frequency.  No flank pain.  Musculoskeletal: No joint pain or swelling.  No decreased range of motion.  No back pain.  Psych: No change in mood or affect. No depression or anxiety.  No memory loss.   PHYSICAL EXAM: Blood pressure 135/83, pulse 57, resp. rate 15, SpO2 99.00%.  General appearance :Awake, alert, not in any distress. Speech Clear. Not toxic Looking HEENT: Atraumatic and Normocephalic, pupils equally reactive to light and accomodation Neck: supple, no JVD. No cervical lymphadenopathy.  Chest:Good air entry bilaterally, no added sounds  CVS: S1 S2 regular, no murmurs, gallops, or rubs.  Abdomen: Bowel sounds present, Non tender and not distended with no gaurding, rigidity or rebound. MS: tender to palpation on left side of chest Extremities: B/L Lower Ext shows no edema, both legs are warm to touch Neurology: Awake alert, and oriented X 3, CN II-XII intact, Non focal Skin:No Rash Wounds:N/A  LABS ON ADMISSION:   Recent Labs  04/25/14 0440  NA 140  K 4.3  CL 102  CO2 27  GLUCOSE 109*  BUN 21  CREATININE 0.79  CALCIUM 9.1    Recent Labs  04/25/14 0440  WBC 4.2  HGB 13.5  HCT 40.0  MCV 94.3  PLT 189     RADIOLOGIC STUDIES ON ADMISSION: Dg Chest 2 View  04/25/2014   CLINICAL DATA:  Chest pain and shortness of breath.  EXAM: CHEST  2 VIEW   COMPARISON:  10/15/2013  FINDINGS: Normal heart size and mediastinal contours. No acute infiltrate or edema. No effusion or pneumothorax. No acute osseous findings. Bone island noted in the anterior right seventh rib.  IMPRESSION: No active cardiopulmonary disease.   Electronically Signed   By: Tiburcio Pea M.D.   On: 04/25/2014 05:24     EKG: Independently reviewed. Sinus bradycardia  ASSESSMENT AND PLAN: Present on Admission:  Chest pain s/p MI with stent placement 2009 -pain began yesterday morning and continues today.  Appears atypical in nature.  Reproducible with palpation.   -EKG shows bradycardia, Xray shows no active cardiopulmonary disease, Troponin negative, BNP negative -Serial troponins ordered -Nitro, Zofran, Morphine PRN.  Continue aspirin, ACE-I, statin, and betablocker -will not call Cardiology unless serologies become positive, however will keep NPO post midnight -Admit to telemetry to monitor overnight  Hypertension -Currently stable -Continue on Benazepril and coreg  Bipolar/Depression -Continue home regimen of Klonopin, Depakote, Haldol, Trazadone  Hyperlipidemia -Continue Simvastatin  Further plan will depend as patient's clinical course evolves and further radiologic and laboratory data become available. Patient will be monitored closely.  Above noted plan was discussed with the patient and he was in agreement.   DVT Prophylaxis: Prophylactic Lovenox  Code Status: Full Code   Total time spent for admission equals 45 minutes.  Collene Leydenariq, Waqas, PA-S, [address] [employer] ShaferMarianne York, [place]PA-C [facility] Pager [phone]  If 7PM-7AM, please contact night-coverage [email or web address] Password [email or web address] 04/25/2014, 9:00 AM  **Disclaimer: This note may have been dictated with voice recognition software. Similar sounding words can inadvertently be transcribed and this note may contain transcription errors which may not have been corrected upon publication of  note.**  Attending Patient was seen, examined,treatment plan was discussed with the Physician extender. I have directly reviewed the clinical findings, lab, imaging studies and management of this patient in detail. I have made the necessary changes to the above noted documentation, and agree with the documentation, as recorded by the Physician extender.  Windell NorfolkS Ghimire MD [facility].

## 2014-04-25 NOTE — ED Notes (Signed)
EDP at bedside  

## 2014-04-26 DIAGNOSIS — R0789 Other chest pain: Principal | ICD-10-CM

## 2014-04-26 LAB — TROPONIN I
Troponin I: <0.3 ng/mL (ref <0.30)
Troponin I: <0.3 ng/mL (ref <0.30)

## 2014-04-26 MED ORDER — TROLAMINE SALICYLATE 10 % EX CREA: TOPICAL_CREAM | Freq: Two times a day (BID) | CUTANEOUS | Status: AC | PRN

## 2014-04-26 NOTE — Discharge Summary (Signed)
Physician Discharge Summary  Jesse SealsWillie Watkins ZOX:[id number] DOB: 07/26/1966 DOA: 04/25/2014  PCP: Doris CheadleADVANI, DEEPAK, MD  Admit date: 04/25/2014 Discharge date: 04/26/2014  Time spent: >45 minutes   Discharge Diagnoses:  Principal Problem:   Atypical chest pain Active Problems:   Essential hypertension, benign   Bipolar disorder, unspecified   Atherosclerotic heart disease of native coronary artery without angina pectoris   Other and unspecified hyperlipidemia   Discharge Condition: stable  Diet recommendation: heart healthy  Filed Weights   04/25/14 1059 04/26/14 0300  Weight: 72.576 kg (160 lb) 72.984 kg (160 lb 14.4 oz)    History of present illness:  Jesse Watkins is a 48 y.o. homeless male with a pmh of MI s/p stent placement 2009, bipolar disorder, depression, hyperlipidemia, and hypertension who presents today with the above noted complaint. He states the pain started yesterday morning while at work in the [employer]. The pain has remained constant throughout yesterday and today. States this morning the pain was a 10/10, but now it's a 5/10. It has somewhat improved after he took Aspirin and Nitroglycerin. Pain is localized to the left part of his chest and reproducible with applied pressure. 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 He has had an episode of nausea, vomiting, and diarrhea in association with the pain. Denies current SOB, hemoptysis, radiation of pain to jaw, neck, or arms. Does not currently smoke but has a 2 pack year history and denies alcohol and illicit drug use. He is positive for barbiturates and opiates on UDS. POC troponin is 0.01 and BNP is 66.5. He will be admitted for observation due to atypical chest pain.   Hospital Course:  Chest pain - quite atypical and reproducible on exam yesterday - resolved and has not recurred - 4 sets of negative Troponins - at this point, will hold off on further cardiac work up - he is advised that if  these spells continue, he needs to f/u with his PCP or cardiology or return to the ER  Hypertension  -Currently stable  -Continue on Benazepril and coreg   Bipolar/Depression  -Continue home regimen of Klonopin, Depakote, Haldol, Trazadone   Hyperlipidemia  -Continue Simvastatin     Procedures:  none  Consultations:  none  Discharge Exam: Filed Vitals:   04/26/14 0300  BP: 107/66  Pulse: 59  Temp: 97.8 F (36.6 C)  Resp: 16    General: AAO x3, no distress Cardiovascular: RRR, no murmurs Respiratory: CTA b/l   Discharge Instructions You were cared for by a hospitalist during your hospital stay. If you have any questions about your discharge medications or the care you received while you were in the hospital after you are discharged, you can call the unit and asked to speak with the hospitalist on call if the hospitalist that took care of you is not available. Once you are discharged, your primary care physician will handle any further medical issues. Please note that NO REFILLS for any discharge medications will be authorized once you are discharged, as it is imperative that you return to your primary care physician (or establish a relationship with a primary care physician if you do not have one) for your aftercare needs so that they can reassess your need for medications and monitor your lab values.     Medication List    ASK your doctor about these medications       albuterol 108 (90 BASE) MCG/ACT inhaler  Commonly known as:  PROVENTIL HFA;VENTOLIN  HFA  Inhale 2 puffs into the lungs every 6 (six) hours as needed for wheezing or shortness of breath.     aspirin 325 MG tablet  Take 1 tablet (325 mg total) by mouth daily.     benazepril 10 MG tablet  Commonly known as:  LOTENSIN  Take 1 tablet (10 mg total) by mouth daily.     benztropine 0.5 MG tablet  Commonly known as:  COGENTIN  Take 0.5 mg by mouth 2 (two) times daily.      butalbital-acetaminophen-caffeine 50-325-40 MG per tablet  Commonly known as:  FIORICET, ESGIC  Take 1 tablet by mouth every 6 (six) hours as needed for headache.     carvedilol 6.25 MG tablet  Commonly known as:  COREG  Take 1 tablet (6.25 mg total) by mouth 2 (two) times daily with a meal.     clonazePAM 0.5 MG tablet  Commonly known as:  KLONOPIN  Take 0.5 mg by mouth 2 (two) times daily as needed for anxiety.     divalproex 500 MG DR tablet  Commonly known as:  DEPAKOTE  Take 500 mg by mouth 3 (three) times daily.     FLUoxetine 10 MG capsule  Commonly known as:  PROZAC  Take 10 mg by mouth daily.     haloperidol 5 MG tablet  Commonly known as:  HALDOL  Take 5 mg by mouth 2 (two) times daily.     nitroGLYCERIN 0.4 MG SL tablet  Commonly known as:  NITROSTAT  Place 1 tablet (0.4 mg total) under the tongue every 5 (five) minutes as needed for chest pain.     pravastatin 10 MG tablet  Commonly known as:  PRAVACHOL  Take 1 tablet (10 mg total) by mouth daily.     traZODone 100 MG tablet  Commonly known as:  DESYREL  Take 100 mg by mouth at bedtime.       Allergies  Allergen Reactions  . Tylenol [Acetaminophen] Other (See Comments)    Increases heart rate       The results of significant diagnostics from this hospitalization (including imaging, microbiology, ancillary and laboratory) are listed below for reference.    Significant Diagnostic Studies: Dg Chest 2 View  04/25/2014   CLINICAL DATA:  Chest pain and shortness of breath.  EXAM: CHEST  2 VIEW  COMPARISON:  10/15/2013  FINDINGS: Normal heart size and mediastinal contours. No acute infiltrate or edema. No effusion or pneumothorax. No acute osseous findings. Bone island noted in the anterior right seventh rib.  IMPRESSION: No active cardiopulmonary disease.   Electronically Signed   By: Tiburcio Pea M.D.   On: 04/25/2014 05:24    Microbiology: No results found for this or any previous visit (from the past  240 hour(s)).   Labs: Basic Metabolic Panel:  Recent Labs Lab 04/25/14 0440 04/25/14 1030  NA 140  --   K 4.3  --   CL 102  --   CO2 27  --   GLUCOSE 109*  --   BUN 21  --   CREATININE 0.79 0.72  CALCIUM 9.1  --    Liver Function Tests: No results found for this basename: AST, ALT, ALKPHOS, BILITOT, PROT, ALBUMIN,  in the last 168 hours No results found for this basename: LIPASE, AMYLASE,  in the last 168 hours No results found for this basename: AMMONIA,  in the last 168 hours CBC:  Recent Labs Lab 04/25/14 0440 04/25/14 1030  WBC 4.2 5.9  HGB 13.5 12.6*  HCT 40.0 37.6*  MCV 94.3 93.1  PLT 189 186   Cardiac Enzymes:  Recent Labs Lab 04/25/14 1030 04/25/14 1215 04/25/14 1831 04/26/14 0010 04/26/14 0623  TROPONINI <0.30 <0.30 <0.30 <0.30 <0.30   BNP: BNP (last 3 results)  Recent Labs  08/29/13 0320 04/25/14 0455  PROBNP 45.3 66.5   CBG: No results found for this basename: GLUCAP,  in the last 168 hours     Signed:  Uyen Eichholz  [facility] 04/26/2014, 10:31 AM

## 2014-04-26 NOTE — Progress Notes (Signed)
UR completed 

## 2014-04-26 NOTE — Progress Notes (Signed)
Patient ambulated in hall, patient denies chest pain

## 2014-04-26 NOTE — Discharge Instructions (Signed)
Cardiac Diet This diet can help prevent heart disease and stroke. Many factors influence your heart health, including eating and exercise habits. Coronary risk rises a lot with abnormal blood fat (lipid) levels. Cardiac meal planning includes limiting unhealthy fats, increasing healthy fats, and making other small dietary changes. General guidelines are as follows:  Adjust calorie intake to reach and maintain desirable body weight.  Limit total fat intake to less than 30% of total calories. Saturated fat should be less than 7% of calories.  Saturated fats are found in animal products and in some vegetable products. Saturated vegetable fats are found in coconut oil, cocoa butter, palm oil, and palm kernel oil. Read labels carefully to avoid these products as much as possible. Use butter in moderation. Choose tub margarines and oils that have 2 grams of fat or less. Good cooking oils are canola and olive oils.  Practice low-fat cooking techniques. Do not fry food. Instead, broil, bake, boil, steam, grill, roast on a rack, stir-fry, or microwave it. Other fat reducing suggestions include:  Remove the skin from poultry.  Remove all visible fat from meats.  Skim the fat off stews, soups, and gravies before serving them.  Steam vegetables in water or broth instead of sauting them in fat.  Avoid foods with trans fat (or hydrogenated oils), such as commercially fried foods and commercially baked goods. Commercial shortening and deep-frying fats will contain trans fat.  Increase intake of fruits, vegetables, whole grains, and legumes to replace foods high in fat.  Increase consumption of nuts, legumes, and seeds to at least 4 servings weekly. One serving of a legume equals  cup, and 1 serving of nuts or seeds equals  cup.  Choose whole grains more often. Have 3 servings per day (a serving is 1 ounce [oz]).  Eat 4 to 5 servings of vegetables per day. A serving of vegetables is 1 cup of raw leafy  vegetables;  cup of raw or cooked cut-up vegetables;  cup of vegetable juice.  Eat 4 to 5 servings of fruit per day. A serving of fruit is 1 medium whole fruit;  cup of dried fruit;  cup of fresh, frozen, or canned fruit;  cup of 100% fruit juice.  Increase your intake of dietary fiber to 20 to 30 grams per day. Insoluble fiber may help lower your risk of heart disease and may help curb your appetite.  Soluble fiber binds cholesterol to be removed from the blood. Foods high in soluble fiber are dried beans, citrus fruits, oats, apples, bananas, broccoli, Brussels sprouts, and eggplant.  Try to include foods fortified with plant sterols or stanols, such as yogurt, breads, juices, or margarines. Choose several fortified foods to achieve a daily intake of 2 to 3 grams of plant sterols or stanols.  Foods with omega-3 fats can help reduce your risk of heart disease. Aim to have a 3.5 oz portion of fatty fish twice per week, such as salmon, mackerel, albacore tuna, sardines, lake trout, or herring. If you wish to take a fish oil supplement, choose one that contains 1 gram of both DHA and EPA.  Limit processed meats to 2 servings (3 oz portion) weekly.  Limit the sodium in your diet to 1500 milligrams (mg) per day. If you have high blood pressure, talk to a registered dietitian about a DASH (Dietary Approaches to Stop Hypertension) eating plan.  Limit sweets and beverages with added sugar, such as soda, to no more than 5 servings per week. One   serving is:   1 tablespoon sugar.  1 tablespoon jelly or jam.   cup sorbet.  1 cup lemonade.   cup regular soda. CHOOSING FOODS Starches  Allowed: Breads: All kinds (wheat, rye, raisin, white, oatmeal, Italian, French, and English muffin bread). Low-fat rolls: English muffins, frankfurter and hamburger buns, bagels, pita bread, tortillas (not fried). Pancakes, waffles, biscuits, and muffins made with recommended oil.  Avoid: Products made with  saturated or trans fats, oils, or whole milk products. Butter rolls, cheese breads, croissants. Commercial doughnuts, muffins, sweet rolls, biscuits, waffles, pancakes, store-bought mixes. Crackers  Allowed: Low-fat crackers and snacks: Animal, graham, rye, saltine (with recommended oil, no lard), oyster, and matzo crackers. Bread sticks, melba toast, rusks, flatbread, pretzels, and light popcorn.  Avoid: High-fat crackers: cheese crackers, butter crackers, and those made with coconut, palm oil, or trans fat (hydrogenated oils). Buttered popcorn. Cereals  Allowed: Hot or cold whole-grain cereals.  Avoid: Cereals containing coconut, hydrogenated vegetable fat, or animal fat. Potatoes / Pasta / Rice  Allowed: All kinds of potatoes, rice, and pasta (such as macaroni, spaghetti, and noodles).  Avoid: Pasta or rice prepared with cream sauce or high-fat cheese. Chow mein noodles, French fries. Vegetables  Allowed: All vegetables and vegetable juices.  Avoid: Fried vegetables. Vegetables in cream, butter, or high-fat cheese sauces. Limit coconut. Fruit in cream or custard. Protein  Allowed: Limit your intake of meat, seafood, and poultry to no more than 6 oz (cooked weight) per day. All lean, well-trimmed beef, veal, pork, and lamb. All chicken and turkey without skin. All fish and shellfish. Wild game: wild duck, rabbit, pheasant, and venison. Egg whites or low-cholesterol egg substitutes may be used as desired. Meatless dishes: recipes with dried beans, peas, lentils, and tofu (soybean curd). Seeds and nuts: all seeds and most nuts.  Avoid: Prime grade and other heavily marbled and fatty meats, such as short ribs, spare ribs, rib eye roast or steak, frankfurters, sausage, bacon, and high-fat luncheon meats, mutton. Caviar. Commercially fried fish. Domestic duck, goose, venison sausage. Organ meats: liver, gizzard, heart, chitterlings, brains, kidney, sweetbreads. Dairy  Allowed: Low-fat  cheeses: nonfat or low-fat cottage cheese (1% or 2% fat), cheeses made with part skim milk, such as mozzarella, farmers, string, or ricotta. (Cheeses should be labeled no more than 2 to 6 grams fat per oz.). Skim (or 1%) milk: liquid, powdered, or evaporated. Buttermilk made with low-fat milk. Drinks made with skim or low-fat milk or cocoa. Chocolate milk or cocoa made with skim or low-fat (1%) milk. Nonfat or low-fat yogurt.  Avoid: Whole milk cheeses, including colby, cheddar, muenster, Monterey Jack, Havarti, Brie, Camembert, American, Swiss, and blue. Creamed cottage cheese, cream cheese. Whole milk and whole milk products, including buttermilk or yogurt made from whole milk, drinks made from whole milk. Condensed milk, evaporated whole milk, and 2% milk. Soups and Combination Foods  Allowed: Low-fat low-sodium soups: broth, dehydrated soups, homemade broth, soups with the fat removed, homemade cream soups made with skim or low-fat milk. Low-fat spaghetti, lasagna, chili, and Spanish rice if low-fat ingredients and low-fat cooking techniques are used.  Avoid: Cream soups made with whole milk, cream, or high-fat cheese. All other soups. Desserts and Sweets  Allowed: Sherbet, fruit ices, gelatins, meringues, and angel food cake. Homemade desserts with recommended fats, oils, and milk products. Jam, jelly, honey, marmalade, sugars, and syrups. Pure sugar candy, such as gum drops, hard candy, jelly beans, marshmallows, mints, and small amounts of dark chocolate.  Avoid: Commercially prepared   cakes, pies, cookies, frosting, pudding, or mixes for these products. Desserts containing whole milk products, chocolate, coconut, lard, palm oil, or palm kernel oil. Ice cream or ice cream drinks. Candy that contains chocolate, coconut, butter, hydrogenated fat, or unknown ingredients. Buttered syrups. Fats and Oils  Allowed: Vegetable oils: safflower, sunflower, corn, soybean, cottonseed, sesame, canola, olive,  or peanut. Non-hydrogenated margarines. Salad dressing or mayonnaise: homemade or commercial, made with a recommended oil. Low or nonfat salad dressing or mayonnaise.  Limit added fats and oils to 6 to 8 tsp per day (includes fats used in cooking, baking, salads, and spreads on bread). Remember to count the "hidden fats" in foods.  Avoid: Solid fats and shortenings: butter, lard, salt pork, bacon drippings. Gravy containing meat fat, shortening, or suet. Cocoa butter, coconut. Coconut oil, palm oil, palm kernel oil, or hydrogenated oils: these ingredients are often used in bakery products, nondairy creamers, whipped toppings, candy, and commercially fried foods. Read labels carefully. Salad dressings made of unknown oils, sour cream, or cheese, such as blue cheese and Roquefort. Cream, all kinds: half-and-half, light, heavy, or whipping. Sour cream or cream cheese (even if "light" or low-fat). Nondairy cream substitutes: coffee creamers and sour cream substitutes made with palm, palm kernel, hydrogenated oils, or coconut oil. Beverages  Allowed: Coffee (regular or decaffeinated), tea. Diet carbonated beverages, mineral water. Alcohol: Check with your caregiver. Moderation is recommended.  Avoid: Whole milk, regular sodas, and juice drinks with added sugar. Condiments  Allowed: All seasonings and condiments. Cocoa powder. "Cream" sauces made with recommended ingredients.  Avoid: Carob powder made with hydrogenated fats. SAMPLE MENU Breakfast   cup orange juice   cup oatmeal  1 slice toast  1 tsp margarine  1 cup skim milk Lunch  Turkey sandwich with 2 oz turkey, 2 slices bread  Lettuce and tomato slices  Fresh fruit  Carrot sticks  Coffee or tea Snack  Fresh fruit or low-fat crackers Dinner  3 oz lean ground beef  1 baked potato  1 tsp margarine   cup asparagus  Lettuce salad  1 tbs non-creamy dressing   cup peach slices  1 cup skim milk Document Released:  06/04/2008 Document Revised: 02/25/2012 Document Reviewed: 10/26/2013 [employer] Patient Information 2015 [employer]. This information is not intended to replace advice given to you by your health care provider. Make sure you discuss any questions you have with your health care provider.  

## 2014-04-29 ENCOUNTER — Other Ambulatory Visit: Payer: Self-pay | Admitting: Internal Medicine

## 2014-07-01 ENCOUNTER — Emergency Department (HOSPITAL_COMMUNITY)
Admission: EM | Admit: 2014-07-01 | Discharge: 2014-07-01 | Disposition: A | Discharge status: Home / Self Care | Payer: Medicaid Other | Attending: Emergency Medicine | Admitting: Emergency Medicine

## 2014-07-01 ENCOUNTER — Encounter (HOSPITAL_COMMUNITY): Payer: Self-pay | Admitting: Emergency Medicine

## 2014-07-01 DIAGNOSIS — F319 Bipolar disorder, unspecified: Secondary | ICD-10-CM | POA: Insufficient documentation

## 2014-07-01 DIAGNOSIS — Z7982 Long term (current) use of aspirin: Secondary | ICD-10-CM | POA: Diagnosis not present

## 2014-07-01 DIAGNOSIS — Z87891 Personal history of nicotine dependence: Secondary | ICD-10-CM | POA: Insufficient documentation

## 2014-07-01 DIAGNOSIS — H5712 Ocular pain, left eye: Secondary | ICD-10-CM | POA: Diagnosis present

## 2014-07-01 DIAGNOSIS — Z79899 Other long term (current) drug therapy: Secondary | ICD-10-CM | POA: Insufficient documentation

## 2014-07-01 DIAGNOSIS — J45909 Unspecified asthma, uncomplicated: Secondary | ICD-10-CM | POA: Diagnosis not present

## 2014-07-01 DIAGNOSIS — H579 Unspecified disorder of eye and adnexa: Secondary | ICD-10-CM | POA: Diagnosis not present

## 2014-07-01 DIAGNOSIS — F419 Anxiety disorder, unspecified: Secondary | ICD-10-CM | POA: Diagnosis not present

## 2014-07-01 DIAGNOSIS — I10 Essential (primary) hypertension: Secondary | ICD-10-CM | POA: Insufficient documentation

## 2014-07-01 DIAGNOSIS — H1032 Unspecified acute conjunctivitis, left eye: Secondary | ICD-10-CM | POA: Diagnosis not present

## 2014-07-01 DIAGNOSIS — H1031 Unspecified acute conjunctivitis, right eye: Secondary | ICD-10-CM

## 2014-07-01 MED ORDER — FLUORESCEIN SODIUM 1 MG OP STRP
1.0000 | ORAL_STRIP | Freq: Once | OPHTHALMIC | Status: DC
  Filled 2014-07-01: qty 1

## 2014-07-01 MED ORDER — POLYMYXIN B-TRIMETHOPRIM 10000-0.1 UNIT/ML-% OP SOLN: 1.0000 [drp] | OPHTHALMIC | Status: AC

## 2014-07-01 MED ORDER — TETRACAINE HCL 0.5 % OP SOLN
2.0000 [drp] | Freq: Once | OPHTHALMIC | Status: DC
  Filled 2014-07-01: qty 2

## 2014-07-01 NOTE — Discharge Instructions (Signed)
Bacterial Conjunctivitis °Bacterial conjunctivitis (commonly called pink eye) is redness, soreness, or puffiness (inflammation) of the white part of your eye. It is caused by a germ called bacteria. These germs can easily spread from person to person (contagious). Your eye often will become red or pink. Your eye may also become irritated, watery, or have a thick discharge.  °HOME CARE  °· Apply a cool, clean washcloth over closed eyelids. Do this for 10-20 minutes, 3-4 times a day while you have pain. °· Gently wipe away any fluid coming from the eye with a warm, wet washcloth or cotton ball. °· Wash your hands often with soap and water. Use paper towels to dry your hands. °· Do not share towels or washcloths. °· Change or wash your pillowcase every day. °· Do not use eye makeup until the infection is gone. °· Do not use machines or drive if your vision is blurry. °· Stop using contact lenses. Do not use them again until your doctor says it is okay. °· Do not touch the tip of the eye drop bottle or medicine tube with your fingers when you put medicine on the eye. °GET HELP RIGHT AWAY IF:  °· Your eye is not better after 3 days of starting your medicine. °· You have a yellowish fluid coming out of the eye. °· You have more pain in the eye. °· Your eye redness is spreading. °· Your vision becomes blurry. °· You have a fever or lasting symptoms for more than 2-3 days. °· You have a fever and your symptoms suddenly get worse. °· You have pain in the face. °· Your face gets red or puffy (swollen). °MAKE SURE YOU:  °· Understand these instructions. °· Will watch this condition. °· Will get help right away if you are not doing well or get worse. °Document Released: 06/04/2008 Document Revised: 08/12/2012 Document Reviewed: 05/01/2012 °[employer]® Patient Information ©2015 [employer]. This information is not intended to replace advice given to you by your health care provider. Make sure you discuss any questions you have  with your health care provider. ° °

## 2014-07-01 NOTE — ED Notes (Signed)
Pt presents with red and irritated L eye x 3 days with drainage.

## 2014-07-01 NOTE — ED Notes (Signed)
Tetracaine and fluor strip given by Katrinka Blazing, NP

## 2014-07-01 NOTE — ED Provider Notes (Signed)
CSN: [id number]     Arrival date & time 07/01/14  1802 History  This chart was scribed for non-physician practitioner, Felicie Morn, NP-C working with Elwin Mocha, MD by Luisa Dago, ED scribe. This patient was seen in room TR04C/TR04C and the patient's care was started at 6:50 PM.    Chief Complaint  Patient presents with  . Eye Problem   Patient is a 48 y.o. male presenting with eye pain. The history is provided by the patient. No language interpreter was used.  Eye Pain This is a new problem. The current episode started more than 2 days ago. The problem occurs constantly. The problem has not changed since onset.Pertinent negatives include no chest pain, no abdominal pain, no headaches and no shortness of breath. Nothing aggravates the symptoms. Nothing relieves the symptoms. He has tried nothing for the symptoms. The treatment provided no relief.   HPI Comments: Jesse Watkins is a 48 y.o. male with a hx of HTN presents to the Emergency Department complaining of gradual onset left eye pain that started 3-4 days ago. Pt is also complaining of associated clear drainage. He states that when he wakes up in the morning his left eye is crusted shut. Pt works with cats and dogs daily. Denies any visual disturbance, fever, chills, nausea, emesis, SOB, or chest pain. He denies any medicinal allergies.  Past Medical History  Diagnosis Date  . Heart attack   . Hypertension   . Bipolar 1 disorder   . Asthma   . Bronchitis   . Shortness of breath   . Anxiety   . BXUXYBFX(832.9)    Past Surgical History  Procedure Laterality Date  . Coronary stent placement     Family History  Problem Relation Age of Onset  . Heart disease Mother   . Heart disease Father   . Stroke Brother   . Cancer Maternal Grandmother     bone cancer    History  Substance Use Topics  . Smoking status: Former Games developer  . Smokeless tobacco: Not on file  . Alcohol Use: No    Review of Systems  Constitutional:  Negative for fever, chills, diaphoresis, appetite change and fatigue.  HENT: Negative for mouth sores, sore throat and trouble swallowing.   Eyes: Positive for pain and discharge. Negative for visual disturbance.  Respiratory: Negative for cough, chest tightness, shortness of breath and wheezing.   Cardiovascular: Negative for chest pain.  Gastrointestinal: Negative for nausea, vomiting, abdominal pain, diarrhea and abdominal distention.  Endocrine: Negative for polydipsia, polyphagia and polyuria.  Genitourinary: Negative for dysuria, frequency and hematuria.  Musculoskeletal: Negative for gait problem.  Skin: Negative for color change, pallor and rash.  Neurological: Negative for dizziness, syncope, light-headedness and headaches.  Hematological: Does not bruise/bleed easily.  Psychiatric/Behavioral: Negative for behavioral problems and confusion.  All other systems reviewed and are negative.     Allergies  Tylenol  Home Medications   Prior to Admission medications   Medication Sig Start Date End Date Taking? Authorizing Provider  albuterol (PROVENTIL HFA;VENTOLIN HFA) 108 (90 BASE) MCG/ACT inhaler Inhale 2 puffs into the lungs every 6 (six) hours as needed for wheezing or shortness of breath. 11/17/13   Doris Cheadle, MD  aspirin 325 MG tablet Take 1 tablet (325 mg total) by mouth daily. 01/12/14   Gaylord Shih, MD  benazepril (LOTENSIN) 10 MG tablet Take 1 tablet (10 mg total) by mouth daily. 02/01/14   Doris Cheadle, MD  benztropine (COGENTIN) 0.5 MG tablet Take  0.5 mg by mouth 2 (two) times daily.    Historical Provider, MD  butalbital-acetaminophen-caffeine (FIORICET, ESGIC) 50-325-40 MG per tablet Take 1 tablet by mouth every 6 (six) hours as needed for headache.    Historical Provider, MD  carvedilol (COREG) 6.25 MG tablet Take 1 tablet (6.25 mg total) by mouth 2 (two) times daily with a meal. 02/01/14   Doris Cheadleeepak Advani, MD  clonazePAM (KLONOPIN) 0.5 MG tablet Take 0.5 mg by mouth 2  (two) times daily as needed for anxiety.    Historical Provider, MD  divalproex (DEPAKOTE) 500 MG DR tablet Take 500 mg by mouth 3 (three) times daily.    Historical Provider, MD  FLUoxetine (PROZAC) 10 MG capsule Take 10 mg by mouth daily.    Historical Provider, MD  haloperidol (HALDOL) 5 MG tablet Take 5 mg by mouth 2 (two) times daily.    Historical Provider, MD  nitroGLYCERIN (NITROSTAT) 0.4 MG SL tablet Place 1 tablet (0.4 mg total) under the tongue every 5 (five) minutes as needed for chest pain. 01/12/14   Gaylord Shihhomas C Wall, MD  pravastatin (PRAVACHOL) 10 MG tablet Take 1 tablet (10 mg total) by mouth daily. 02/01/14   Doris Cheadleeepak Advani, MD  traZODone (DESYREL) 100 MG tablet Take 100 mg by mouth at bedtime.    Historical Provider, MD  trolamine salicylate (ASPERCREME) 10 % cream Apply topically 2 (two) times daily as needed for muscle pain (chest). 04/26/14   Calvert CantorSaima Rizwan, MD   Triage Vitals:BP 149/95  Pulse 62  Temp(Src) 98.5 F (36.9 C)  Resp 18  Ht 5\' 5"  (1.651 m)  Wt 165 lb (74.844 kg)  BMI 27.46 kg/m2  SpO2 100%  Physical Exam  Nursing note and vitals reviewed. Constitutional: He appears well-developed and well-nourished. No distress.  HENT:  Head: Normocephalic and atraumatic.  Eyes: Right eye exhibits no discharge. Left eye exhibits discharge. Left conjunctiva is injected.  Minimal discharge noted.  Neck: Neck supple.  Cardiovascular: Normal rate, regular rhythm and normal heart sounds.  Exam reveals no gallop and no friction rub.   No murmur heard. Pulmonary/Chest: Effort normal and breath sounds normal. No respiratory distress.  Abdominal: Soft. He exhibits no distension. There is no tenderness.  Musculoskeletal: He exhibits no edema and no tenderness.  Neurological: He is alert.  Skin: Skin is warm and dry.  Psychiatric: He has a normal mood and affect. His behavior is normal. Thought content normal.    ED Course  Procedures (including critical care time)  DIAGNOSTIC  STUDIES: Oxygen Saturation is 100% on RA, normal by my interpretation.    COORDINATION OF CARE: 6:53 PM- Pt advised of plan for treatment and pt agrees.  Medications  tetracaine (PONTOCAINE) 0.5 % ophthalmic solution 2 drop (not administered)  fluorescein ophthalmic strip 1 strip (not administered)    Labs Review Labs Reviewed - No data to display  Imaging Review No results found.   EKG Interpretation None      MDM   Final diagnoses:  None    Conjunctivitis left eye.  Polytrim rx, opthalmology follow-up if no improvement.  I personally performed the services described in this documentation, which was scribed in my presence. The recorded information has been reviewed and is accurate.    Jimmye Normanavid John Sierra Spargo, NP 07/01/14 [phone]

## 2014-07-02 NOTE — ED Provider Notes (Signed)
Medical screening examination/treatment/procedure(s) were performed by non-physician practitioner and as supervising physician I was immediately available for consultation/collaboration.   EKG Interpretation None        Elwin Mocha, MD 07/02/14 [phone]

## 2015-11-25 IMAGING — CR DG CHEST 2V
2 series · 2 of 2 positions shown · non-contrast
Comparison: 10/15/2013

CLINICAL DATA: Chest pain and shortness of breath.

EXAM:
CHEST  2 VIEW

[w chest pa]
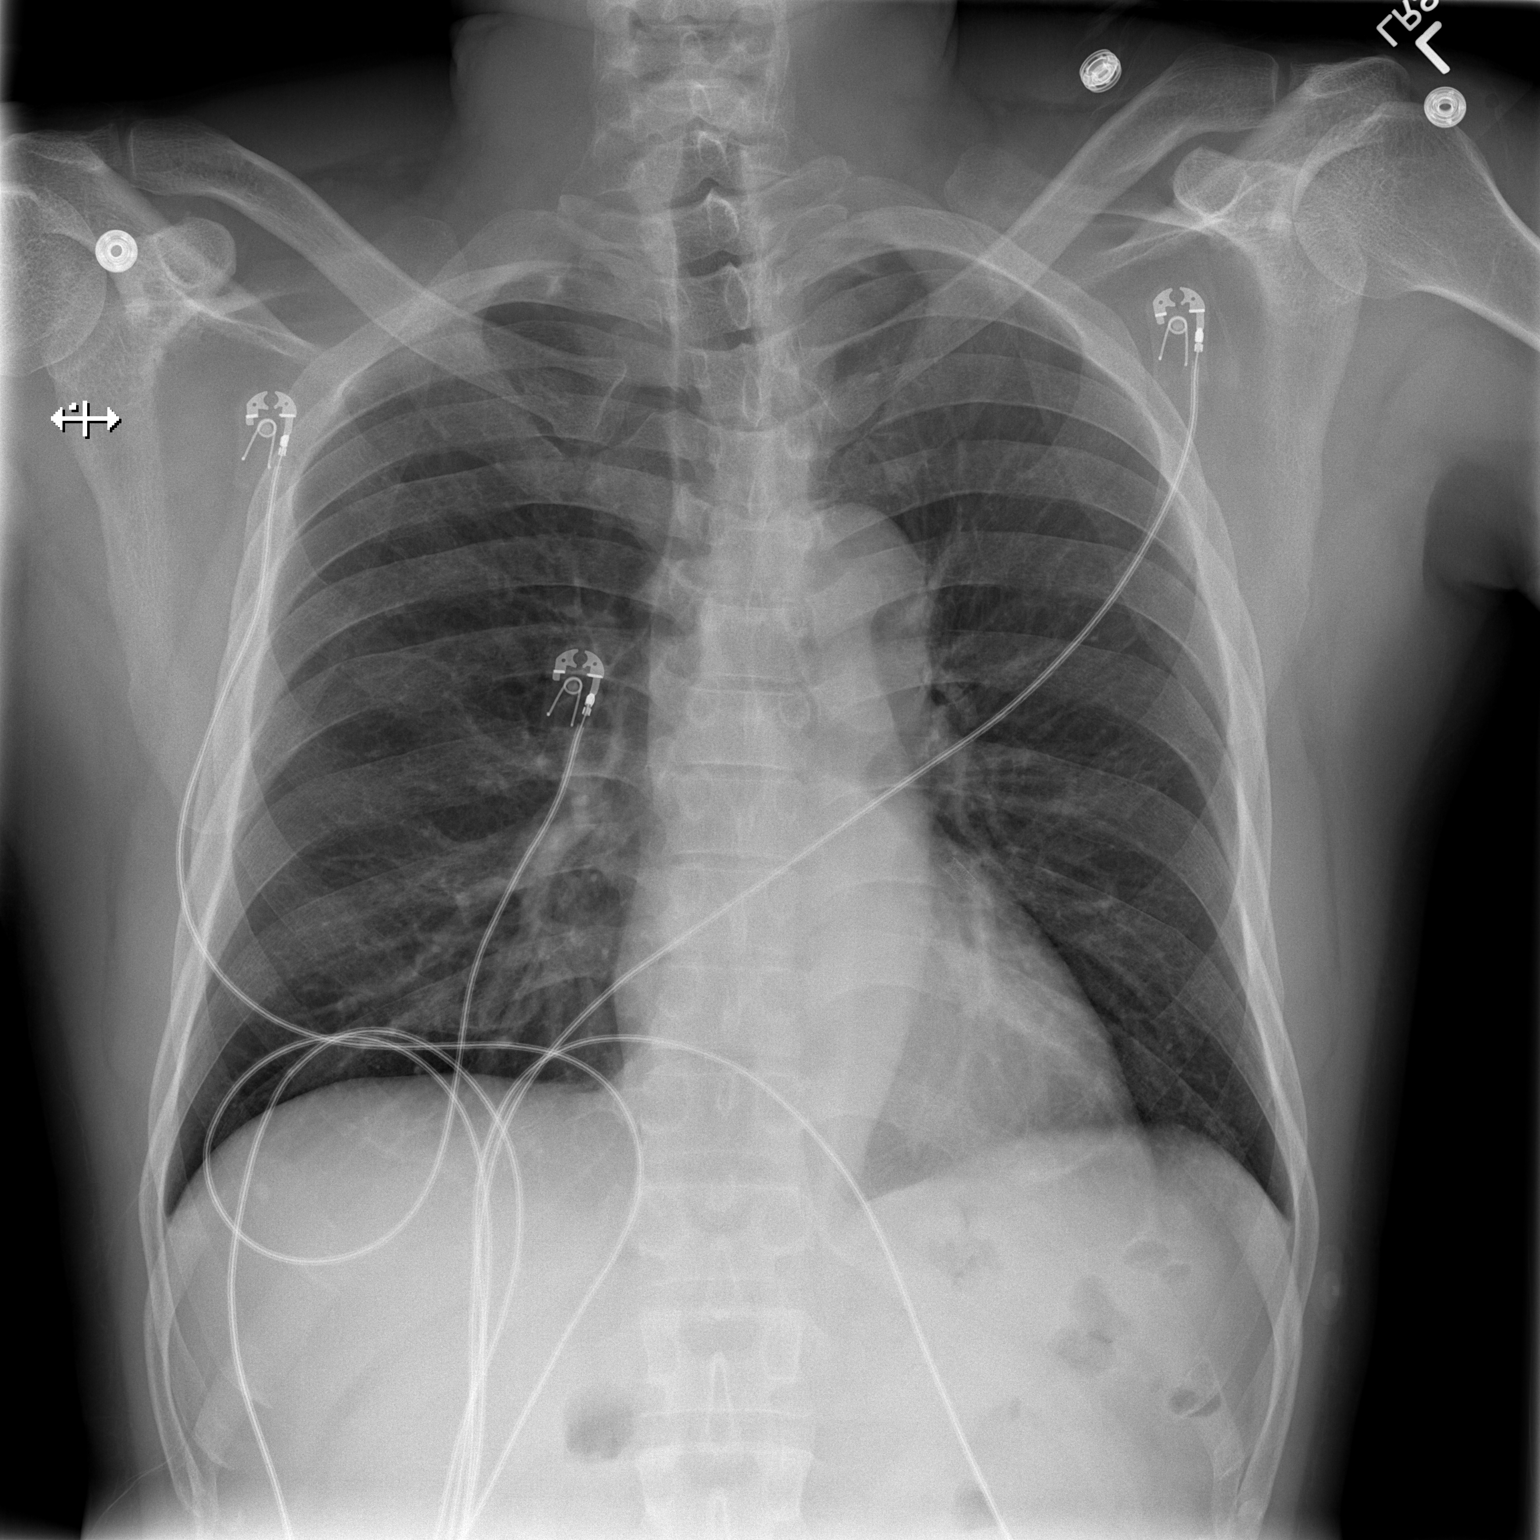

[w chest lat]
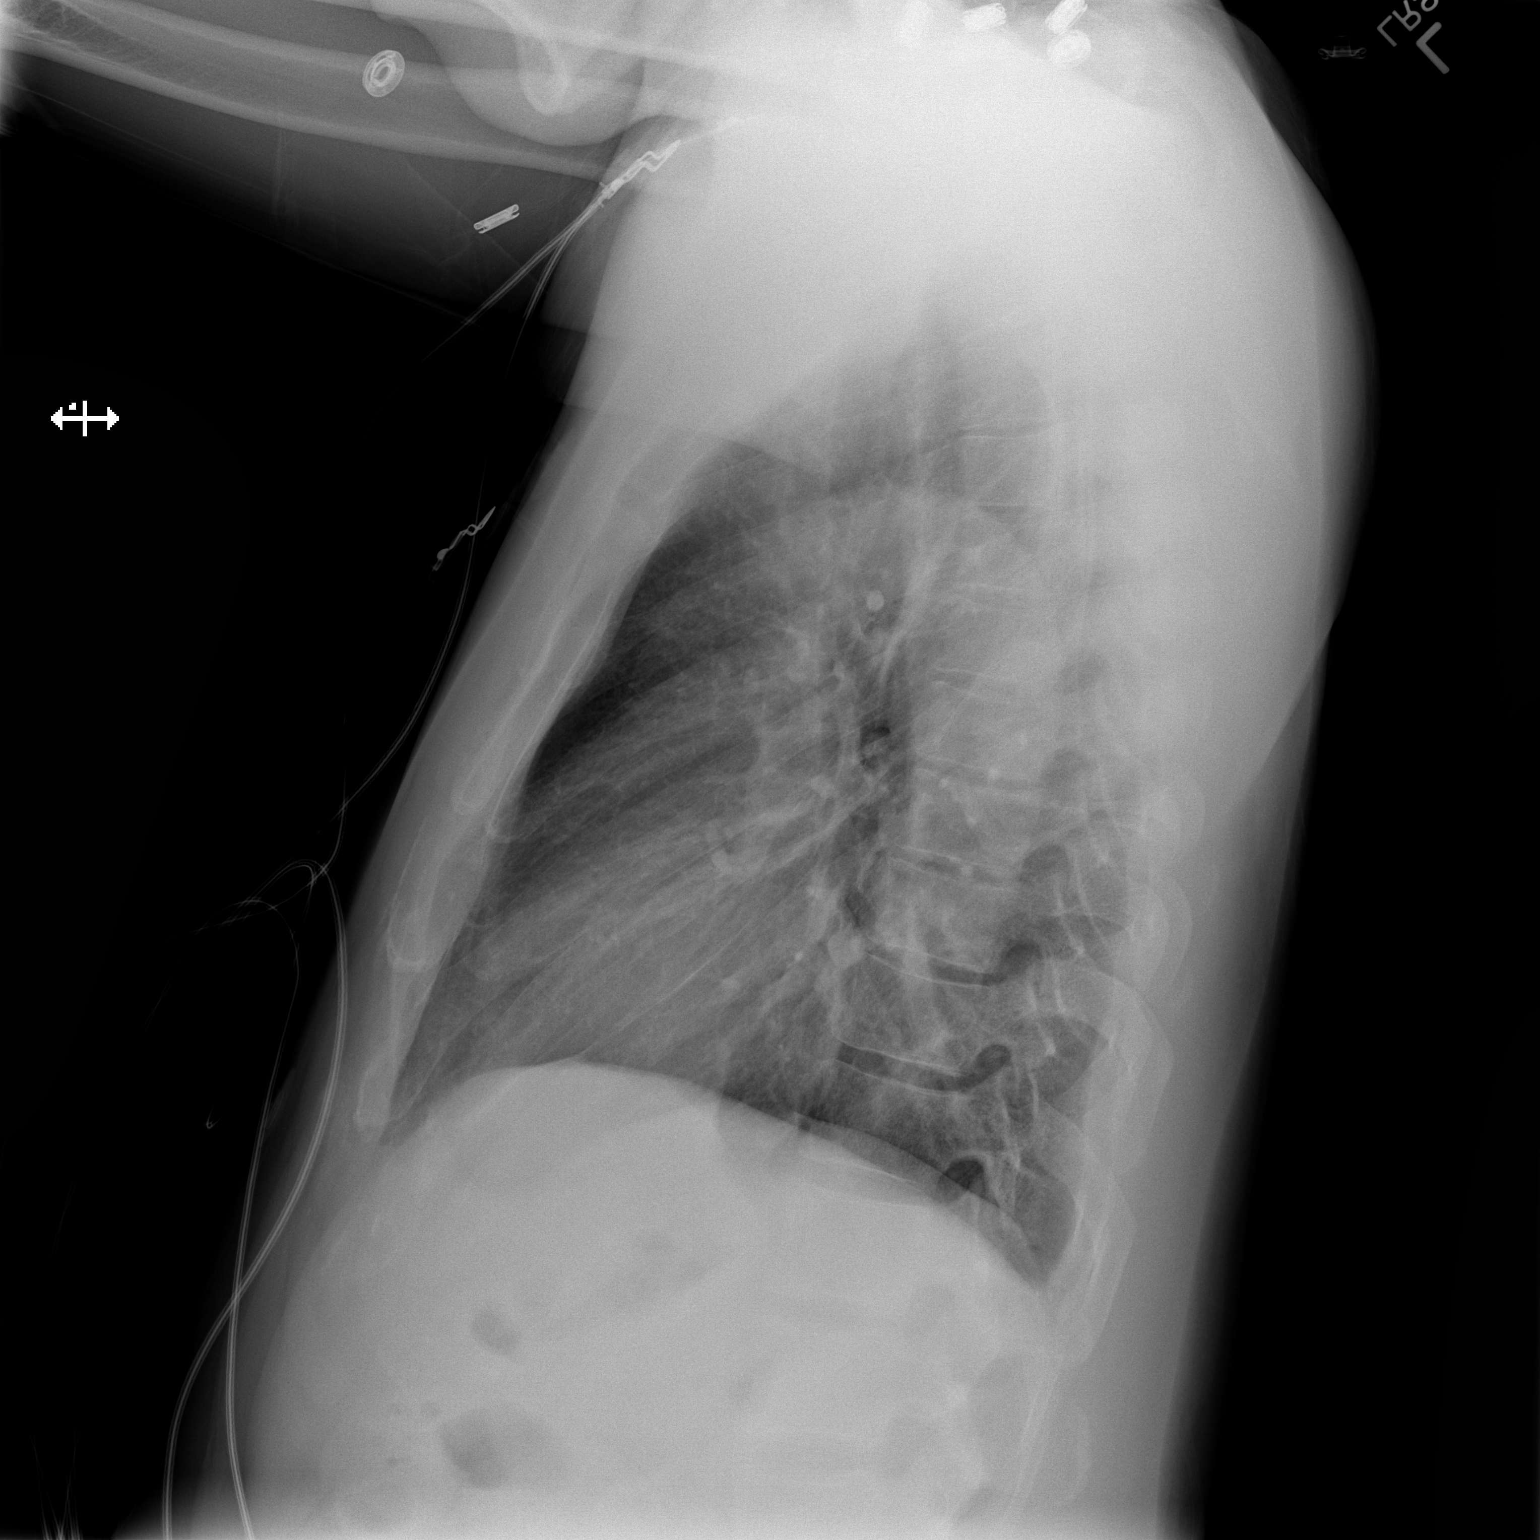

[2 of 2 positions shown; findings below may reference images not displayed]

FINDINGS: Normal heart size and mediastinal contours. No acute infiltrate or
edema. No effusion or pneumothorax. No acute osseous findings. Bone
island noted in the anterior right seventh rib.
IMPRESSION: No active cardiopulmonary disease.

## 2024-05-10 DEATH — deceased
